# Patient Record
Sex: Female | Born: 1960 | Race: White | Hispanic: No | Marital: Single | Smoking: Current every day smoker
Health system: Southern US, Community
[De-identification: ages and names within clinical notes are randomized; demographics above are authoritative.]

## PROBLEM LIST (undated history)

## (undated) DIAGNOSIS — R112 Nausea with vomiting, unspecified: Secondary | ICD-10-CM

## (undated) DIAGNOSIS — I10 Essential (primary) hypertension: Secondary | ICD-10-CM

## (undated) DIAGNOSIS — I82409 Acute embolism and thrombosis of unspecified deep veins of unspecified lower extremity: Secondary | ICD-10-CM

## (undated) DIAGNOSIS — F32A Depression, unspecified: Secondary | ICD-10-CM

## (undated) DIAGNOSIS — F329 Major depressive disorder, single episode, unspecified: Secondary | ICD-10-CM

## (undated) DIAGNOSIS — I509 Heart failure, unspecified: Secondary | ICD-10-CM

## (undated) DIAGNOSIS — F419 Anxiety disorder, unspecified: Secondary | ICD-10-CM

## (undated) DIAGNOSIS — R011 Cardiac murmur, unspecified: Secondary | ICD-10-CM

## (undated) DIAGNOSIS — I2510 Atherosclerotic heart disease of native coronary artery without angina pectoris: Secondary | ICD-10-CM

## (undated) DIAGNOSIS — Z9889 Other specified postprocedural states: Secondary | ICD-10-CM

## (undated) DIAGNOSIS — M1990 Unspecified osteoarthritis, unspecified site: Secondary | ICD-10-CM

## (undated) DIAGNOSIS — I219 Acute myocardial infarction, unspecified: Secondary | ICD-10-CM

## (undated) DIAGNOSIS — I739 Peripheral vascular disease, unspecified: Secondary | ICD-10-CM

## (undated) HISTORY — PX: SALPINGOOPHORECTOMY: SHX82

## (undated) HISTORY — DX: Acute myocardial infarction, unspecified: I21.9

## (undated) HISTORY — PX: ANTERIOR AND POSTERIOR VAGINAL REPAIR: SUR5

## (undated) HISTORY — PX: OTHER SURGICAL HISTORY: SHX169

## (undated) HISTORY — PX: BACK SURGERY: SHX140

## (undated) HISTORY — PX: ABDOMINAL HYSTERECTOMY: SHX81

## (undated) HISTORY — PX: BREAST LUMPECTOMY: SHX2

## (undated) HISTORY — DX: Atherosclerotic heart disease of native coronary artery without angina pectoris: I25.10

## (undated) HISTORY — PX: TENOPLASTY HAMSTRING / FEMUR: SUR1335

## (undated) HISTORY — DX: Heart failure, unspecified: I50.9

## (undated) HISTORY — PX: TUBAL LIGATION: SHX77

---

## 1998-03-26 ENCOUNTER — Other Ambulatory Visit: Admission: RE | Admit: 1998-03-26 | Discharge: 1998-03-26 | Payer: Self-pay | Admitting: Obstetrics and Gynecology

## 1999-03-07 ENCOUNTER — Observation Stay (HOSPITAL_COMMUNITY): Admission: RE | Admit: 1999-03-07 | Discharge: 1999-03-08 | Payer: Self-pay | Admitting: Orthopedic Surgery

## 1999-03-07 ENCOUNTER — Encounter: Payer: Self-pay | Admitting: Orthopedic Surgery

## 2006-01-22 ENCOUNTER — Other Ambulatory Visit: Admission: RE | Admit: 2006-01-22 | Discharge: 2006-01-22 | Payer: Self-pay | Admitting: Obstetrics and Gynecology

## 2006-10-11 ENCOUNTER — Ambulatory Visit (HOSPITAL_COMMUNITY): Admission: RE | Admit: 2006-10-11 | Discharge: 2006-10-11 | Payer: Self-pay | Admitting: Specialist

## 2013-10-13 ENCOUNTER — Emergency Department (HOSPITAL_BASED_OUTPATIENT_CLINIC_OR_DEPARTMENT_OTHER): Payer: No Typology Code available for payment source

## 2013-10-13 ENCOUNTER — Encounter (HOSPITAL_BASED_OUTPATIENT_CLINIC_OR_DEPARTMENT_OTHER): Payer: Self-pay | Admitting: Emergency Medicine

## 2013-10-13 ENCOUNTER — Emergency Department (HOSPITAL_BASED_OUTPATIENT_CLINIC_OR_DEPARTMENT_OTHER)
Admission: EM | Admit: 2013-10-13 | Discharge: 2013-10-13 | Disposition: A | Discharge status: Home / Self Care | Payer: No Typology Code available for payment source | Attending: Emergency Medicine | Admitting: Emergency Medicine

## 2013-10-13 DIAGNOSIS — W19XXXA Unspecified fall, initial encounter: Secondary | ICD-10-CM

## 2013-10-13 DIAGNOSIS — F17200 Nicotine dependence, unspecified, uncomplicated: Secondary | ICD-10-CM | POA: Insufficient documentation

## 2013-10-13 DIAGNOSIS — Y9301 Activity, walking, marching and hiking: Secondary | ICD-10-CM | POA: Insufficient documentation

## 2013-10-13 DIAGNOSIS — S2231XA Fracture of one rib, right side, initial encounter for closed fracture: Secondary | ICD-10-CM

## 2013-10-13 DIAGNOSIS — I10 Essential (primary) hypertension: Secondary | ICD-10-CM | POA: Insufficient documentation

## 2013-10-13 DIAGNOSIS — F329 Major depressive disorder, single episode, unspecified: Secondary | ICD-10-CM | POA: Insufficient documentation

## 2013-10-13 DIAGNOSIS — S3981XA Other specified injuries of abdomen, initial encounter: Secondary | ICD-10-CM | POA: Insufficient documentation

## 2013-10-13 DIAGNOSIS — S2249XA Multiple fractures of ribs, unspecified side, initial encounter for closed fracture: Secondary | ICD-10-CM | POA: Insufficient documentation

## 2013-10-13 DIAGNOSIS — Y9289 Other specified places as the place of occurrence of the external cause: Secondary | ICD-10-CM | POA: Insufficient documentation

## 2013-10-13 DIAGNOSIS — W010XXA Fall on same level from slipping, tripping and stumbling without subsequent striking against object, initial encounter: Secondary | ICD-10-CM | POA: Insufficient documentation

## 2013-10-13 DIAGNOSIS — F3289 Other specified depressive episodes: Secondary | ICD-10-CM | POA: Insufficient documentation

## 2013-10-13 DIAGNOSIS — Z79899 Other long term (current) drug therapy: Secondary | ICD-10-CM | POA: Insufficient documentation

## 2013-10-13 DIAGNOSIS — F411 Generalized anxiety disorder: Secondary | ICD-10-CM | POA: Insufficient documentation

## 2013-10-13 HISTORY — DX: Essential (primary) hypertension: I10

## 2013-10-13 HISTORY — DX: Depression, unspecified: F32.A

## 2013-10-13 HISTORY — DX: Major depressive disorder, single episode, unspecified: F32.9

## 2013-10-13 HISTORY — DX: Anxiety disorder, unspecified: F41.9

## 2013-10-13 LAB — BASIC METABOLIC PANEL
BUN: 16 mg/dL (ref 6–23)
CO2: 27 mEq/L (ref 19–32)
Calcium: 9.5 mg/dL (ref 8.4–10.5)
Chloride: 102 mEq/L (ref 96–112)
Creatinine, Ser: 1 mg/dL (ref 0.50–1.10)
GFR calc non Af Amer: 64 mL/min — ABNORMAL LOW (ref >90)
Glucose, Bld: 115 mg/dL — ABNORMAL HIGH (ref 70–99)
Potassium: 3.8 mEq/L (ref 3.5–5.1)

## 2013-10-13 MED ORDER — OXYCODONE-ACETAMINOPHEN 5-325 MG PO TABS
2.0000 | ORAL_TABLET | Freq: Once | ORAL | Status: AC
  Administered 2013-10-13: 2 via ORAL
  Filled 2013-10-13: qty 2

## 2013-10-13 MED ORDER — OXYCODONE-ACETAMINOPHEN 5-325 MG PO TABS: 2.0000 | ORAL_TABLET | ORAL | Status: DC | PRN

## 2013-10-13 MED ORDER — CLONIDINE HCL 0.1 MG PO TABS
0.2000 mg | ORAL_TABLET | Freq: Once | ORAL | Status: DC
  Filled 2013-10-13: qty 2

## 2013-10-13 MED ORDER — CLONIDINE HCL 0.1 MG PO TABS
0.1000 mg | ORAL_TABLET | Freq: Once | ORAL | Status: AC
  Administered 2013-10-13: 0.1 mg via ORAL

## 2013-10-13 MED ORDER — IOHEXOL 300 MG/ML  SOLN
100.0000 mL | Freq: Once | INTRAMUSCULAR | Status: AC | PRN
  Administered 2013-10-13: 100 mL via INTRAVENOUS

## 2013-10-13 NOTE — ED Notes (Signed)
Rt side pain onset last pm ater falling on shop vac,  Pt ambulatory  Lo loc

## 2013-10-13 NOTE — ED Notes (Signed)
Fall. She tripped over a shop vac while walking to the bathroom in the dark last night. Right rib pain. BP noted. She admits she did not take her BP medication this am.

## 2013-10-13 NOTE — ED Provider Notes (Signed)
CSN: [id number]     Arrival date & time 10/13/13  1144 History   First MD Initiated Contact with Patient 10/13/13 1418     Chief Complaint  Patient presents with  . Fall   (Consider location/radiation/quality/duration/timing/severity/associated sxs/prior Treatment) HPI 52 year old female who fell during the night last night when she was going to the bathroom. She states she landed on a shop vac.  She has pain in the right flank area. She denies striking her head or having any loss of consciousness. She denies any neck or back pain, numbness, tingling, or weakness. She does not feel lightheaded. Past Medical History  Diagnosis Date  . Hypertension   . Depression   . Anxiety    Past Surgical History  Procedure Laterality Date  . Back surgery    . Abdominal hysterectomy     No family history on file. History  Substance Use Topics  . Smoking status: Current Every Day Smoker -- 0.50 packs/day    Types: Cigarettes  . Smokeless tobacco: Not on file  . Alcohol Use: Yes   OB History   Grav Para Term Preterm Abortions TAB SAB Ect Mult Living                 Review of Systems  All other systems reviewed and are negative.    Allergies  Review of patient's allergies indicates no known allergies.  Home Medications   Current Outpatient Rx  Name  Route  Sig  Dispense  Refill  . ALPRAZolam (XANAX) 1 MG tablet   Oral   Take 1 mg by mouth at bedtime as needed for sleep.         . AmLODIPine Besylate (NORVASC PO)   Oral   Take by mouth.         . CLONIDINE HCL PO   Oral   Take by mouth.         Marland Kitchen HYDROCHLOROTHIAZIDE PO   Oral   Take by mouth.         Marland Kitchen LISINOPRIL PO   Oral   Take by mouth.         . Nebivolol HCl (BYSTOLIC PO)   Oral   Take by mouth.         . venlafaxine (EFFEXOR) 75 MG tablet   Oral   Take 75 mg by mouth 2 (two) times daily.          BP 256/93  Pulse 77  Temp(Src) 98.6 F (37 C) (Oral)  Resp 20  Ht 5\' 5"  (1.651 m)  Wt 220  lb (99.791 kg)  BMI 36.61 kg/m2 Physical Exam  Nursing note and vitals reviewed. Constitutional: She is oriented to person, place, and time. She appears well-developed and well-nourished.  HENT:  Head: Normocephalic and atraumatic.  Eyes: Conjunctivae are normal. Pupils are equal, round, and reactive to light.  Neck: Normal range of motion. Neck supple.  Cardiovascular: Normal rate, regular rhythm, normal heart sounds and intact distal pulses.   Pulmonary/Chest: Effort normal and breath sounds normal.  TTP right lateral lower ribs and ruq of abdomen.   Abdominal: Soft. Bowel sounds are normal.  Musculoskeletal: Normal range of motion. She exhibits tenderness.  Neurological: She is alert and oriented to person, place, and time. She has normal reflexes. No cranial nerve deficit. Coordination normal.  Skin: Skin is warm and dry.  Psychiatric: She has a normal mood and affect. Her behavior is normal. Judgment and thought content normal.    ED Course  Procedures (including critical care time) Labs Review Labs Reviewed  BASIC METABOLIC PANEL   Imaging Review Dg Ribs Unilateral W/chest Right  10/13/2013   CLINICAL DATA:  Right lower rib pain.  EXAM: RIGHT RIBS AND CHEST - 3+ VIEW  COMPARISON:  Chest x-Tushar Enns 10/14/2010.  FINDINGS: The cardiac silhouette, mediastinal and hilar contours are within normal limits and stable. The lungs are clear. No pleural effusion or pneumothorax. A hiatal hernia is again noted.  Dedicated views of the right ribs demonstrate nondisplaced 8th and 9th anterior rib fractures.  IMPRESSION: No acute cardiopulmonary findings.  Right 8th and 9th rib fractures.   Electronically Signed   By: Loralie Champagne M.D.   On: 10/13/2013 13:41    EKG Interpretation   None       MDM  No diagnosis found. 52 year old female tripped and fell and presents today with rib fractures and hypertension. Should not take her antihypertensives today. She is on clonidine. She is also  having pain. She's having a CT of her abdomen she has right upper quadrant pain. Patient with two rib fractures.  No intrabdominal injury- ct report question diverticultis which is not consistent with her history or exam.    Hilario Quarry, MD 10/13/13 [id number]

## 2016-02-14 DIAGNOSIS — N950 Postmenopausal bleeding: Secondary | ICD-10-CM | POA: Insufficient documentation

## 2016-09-12 DIAGNOSIS — Z79899 Other long term (current) drug therapy: Secondary | ICD-10-CM | POA: Diagnosis not present

## 2016-09-12 DIAGNOSIS — E559 Vitamin D deficiency, unspecified: Secondary | ICD-10-CM | POA: Diagnosis not present

## 2016-09-12 DIAGNOSIS — I10 Essential (primary) hypertension: Secondary | ICD-10-CM | POA: Diagnosis not present

## 2016-09-12 DIAGNOSIS — E785 Hyperlipidemia, unspecified: Secondary | ICD-10-CM | POA: Diagnosis not present

## 2016-09-12 DIAGNOSIS — F419 Anxiety disorder, unspecified: Secondary | ICD-10-CM | POA: Diagnosis not present

## 2016-10-10 DIAGNOSIS — E785 Hyperlipidemia, unspecified: Secondary | ICD-10-CM | POA: Diagnosis not present

## 2016-10-10 DIAGNOSIS — F419 Anxiety disorder, unspecified: Secondary | ICD-10-CM | POA: Diagnosis not present

## 2016-10-10 DIAGNOSIS — F329 Major depressive disorder, single episode, unspecified: Secondary | ICD-10-CM | POA: Diagnosis not present

## 2016-10-10 DIAGNOSIS — I10 Essential (primary) hypertension: Secondary | ICD-10-CM | POA: Diagnosis not present

## 2017-02-13 DIAGNOSIS — J40 Bronchitis, not specified as acute or chronic: Secondary | ICD-10-CM | POA: Diagnosis not present

## 2017-02-13 DIAGNOSIS — E559 Vitamin D deficiency, unspecified: Secondary | ICD-10-CM | POA: Diagnosis not present

## 2017-02-13 DIAGNOSIS — F17210 Nicotine dependence, cigarettes, uncomplicated: Secondary | ICD-10-CM | POA: Diagnosis not present

## 2017-02-13 DIAGNOSIS — E785 Hyperlipidemia, unspecified: Secondary | ICD-10-CM | POA: Diagnosis not present

## 2017-02-22 DIAGNOSIS — J029 Acute pharyngitis, unspecified: Secondary | ICD-10-CM | POA: Diagnosis not present

## 2017-02-22 DIAGNOSIS — K1379 Other lesions of oral mucosa: Secondary | ICD-10-CM | POA: Diagnosis not present

## 2017-02-22 DIAGNOSIS — R05 Cough: Secondary | ICD-10-CM | POA: Diagnosis not present

## 2017-07-30 DIAGNOSIS — I10 Essential (primary) hypertension: Secondary | ICD-10-CM | POA: Diagnosis not present

## 2017-07-30 DIAGNOSIS — Z124 Encounter for screening for malignant neoplasm of cervix: Secondary | ICD-10-CM | POA: Diagnosis not present

## 2017-07-30 DIAGNOSIS — Z0001 Encounter for general adult medical examination with abnormal findings: Secondary | ICD-10-CM | POA: Diagnosis not present

## 2017-07-30 DIAGNOSIS — F329 Major depressive disorder, single episode, unspecified: Secondary | ICD-10-CM | POA: Diagnosis not present

## 2017-07-30 DIAGNOSIS — F419 Anxiety disorder, unspecified: Secondary | ICD-10-CM | POA: Diagnosis not present

## 2017-07-30 DIAGNOSIS — E559 Vitamin D deficiency, unspecified: Secondary | ICD-10-CM | POA: Diagnosis not present

## 2017-08-02 DIAGNOSIS — Z1211 Encounter for screening for malignant neoplasm of colon: Secondary | ICD-10-CM | POA: Diagnosis not present

## 2017-08-06 ENCOUNTER — Emergency Department (HOSPITAL_COMMUNITY): Payer: BLUE CROSS/BLUE SHIELD

## 2017-08-06 ENCOUNTER — Emergency Department (HOSPITAL_COMMUNITY)
Admission: EM | Admit: 2017-08-06 | Discharge: 2017-08-06 | Disposition: A | Discharge status: Home / Self Care | Payer: BLUE CROSS/BLUE SHIELD | Attending: Physician Assistant | Admitting: Physician Assistant

## 2017-08-06 ENCOUNTER — Encounter (HOSPITAL_COMMUNITY): Payer: Self-pay | Admitting: *Deleted

## 2017-08-06 DIAGNOSIS — M549 Dorsalgia, unspecified: Secondary | ICD-10-CM | POA: Diagnosis not present

## 2017-08-06 DIAGNOSIS — Z79899 Other long term (current) drug therapy: Secondary | ICD-10-CM | POA: Diagnosis not present

## 2017-08-06 DIAGNOSIS — Y9389 Activity, other specified: Secondary | ICD-10-CM | POA: Insufficient documentation

## 2017-08-06 DIAGNOSIS — I10 Essential (primary) hypertension: Secondary | ICD-10-CM | POA: Diagnosis not present

## 2017-08-06 DIAGNOSIS — S299XXA Unspecified injury of thorax, initial encounter: Secondary | ICD-10-CM | POA: Diagnosis not present

## 2017-08-06 DIAGNOSIS — M546 Pain in thoracic spine: Secondary | ICD-10-CM | POA: Diagnosis not present

## 2017-08-06 DIAGNOSIS — S80812A Abrasion, left lower leg, initial encounter: Secondary | ICD-10-CM | POA: Diagnosis not present

## 2017-08-06 DIAGNOSIS — M25511 Pain in right shoulder: Secondary | ICD-10-CM | POA: Diagnosis not present

## 2017-08-06 DIAGNOSIS — Y92007 Garden or yard of unspecified non-institutional (private) residence as the place of occurrence of the external cause: Secondary | ICD-10-CM | POA: Diagnosis not present

## 2017-08-06 DIAGNOSIS — Y999 Unspecified external cause status: Secondary | ICD-10-CM | POA: Insufficient documentation

## 2017-08-06 DIAGNOSIS — F17210 Nicotine dependence, cigarettes, uncomplicated: Secondary | ICD-10-CM | POA: Insufficient documentation

## 2017-08-06 DIAGNOSIS — S3992XA Unspecified injury of lower back, initial encounter: Secondary | ICD-10-CM | POA: Diagnosis not present

## 2017-08-06 DIAGNOSIS — M25551 Pain in right hip: Secondary | ICD-10-CM | POA: Diagnosis not present

## 2017-08-06 DIAGNOSIS — W01198A Fall on same level from slipping, tripping and stumbling with subsequent striking against other object, initial encounter: Secondary | ICD-10-CM | POA: Insufficient documentation

## 2017-08-06 DIAGNOSIS — S79911A Unspecified injury of right hip, initial encounter: Secondary | ICD-10-CM | POA: Diagnosis not present

## 2017-08-06 DIAGNOSIS — S300XXA Contusion of lower back and pelvis, initial encounter: Secondary | ICD-10-CM | POA: Insufficient documentation

## 2017-08-06 MED ORDER — HYDROCODONE-ACETAMINOPHEN 5-325 MG PO TABS: 1.0000 | ORAL_TABLET | Freq: Four times a day (QID) | ORAL | 0 refills | Status: DC | PRN

## 2017-08-06 MED ORDER — CYCLOBENZAPRINE HCL 10 MG PO TABS: 10.0000 mg | ORAL_TABLET | Freq: Every evening | ORAL | 0 refills | Status: DC | PRN

## 2017-08-06 MED ORDER — OXYCODONE-ACETAMINOPHEN 5-325 MG PO TABS
1.0000 | ORAL_TABLET | Freq: Once | ORAL | Status: AC
  Administered 2017-08-06: 1 via ORAL
  Filled 2017-08-06: qty 1

## 2017-08-06 NOTE — Discharge Instructions (Signed)
Please read instructions below. Apply ice to your back (especially the bruise) and shoulder for 20 minutes at a time. You can take Norco every 6 hours as needed for severe pain. Do not take tylenol, drive, or drink alcohol while taking this medication. You can take flexeril at every 8 hours or just at bedtime as needed for muscle spasm. You can take advil every 6 hours as needed for pain. Schedule an appointment with the orthopedic specialist if symptoms persist to follow-up on your injury. Return to ER if new numbness or tingling in your arms or legs, inability to urinate, inability to hold your bowels, or weakness in your extremities.

## 2017-08-06 NOTE — ED Notes (Signed)
PA at the bedside.

## 2017-08-06 NOTE — ED Provider Notes (Signed)
[facility]-EMERGENCY DEPT Provider Note   CSN: [id number] Arrival date & time: 08/06/17  4098     History   Chief Complaint Chief Complaint  Patient presents with  . Fall    HPI Rachel Chen is a 56 y.o. female w past medical history of hypertension, depression, presenting from urgent care with acute onset of persistent right shoulder and hip pain status post mechanical fall that occurred Friday evening. Patient states she was removing the cover from her hot tub when she lost balance, falling forward into the full hot tub. She states she believes her right side struck the seats in the hot tub during the fall. Denies head trauma or LOC. States pain in the right shoulder is located in her scapula, that is worse with movement and breathing. Localizes right hip/lower back pain that is worse with sitting. Also reports abrasion to left anterior shin that has minimally pain. Denies numbness or tingling in extremities, neck pain, bowel or bladder incontinence, chest pain, shortness of breath, abd pain, headache, or anything other symptoms. She is not on  Anticoagulation. States she has not taken her morning BP medications. The history is provided by the patient.    Past Medical History:  Diagnosis Date  . Anxiety   . Depression   . Hypertension     There are no active problems to display for this patient.   Past Surgical History:  Procedure Laterality Date  . ABDOMINAL HYSTERECTOMY    . BACK SURGERY      OB History    No data available       Home Medications    Prior to Admission medications   Medication Sig Start Date End Date Taking? Authorizing Provider  ALPRAZolam Prudy Feeler) 1 MG tablet Take 1 mg by mouth at bedtime as needed for sleep.    [provider]  AmLODIPine Besylate (NORVASC PO) Take by mouth.    [provider]  CLONIDINE HCL PO Take by mouth.    [provider]  cyclobenzaprine (FLEXERIL) 10 MG tablet Take 1 tablet (10 mg total) by mouth  at bedtime as needed for muscle spasms. 08/06/17   Russo, [place] N, PA-C  HYDROCHLOROTHIAZIDE PO Take by mouth.    [provider]  HYDROcodone-acetaminophen (NORCO) 5-325 MG tablet Take 1-2 tablets by mouth every 6 (six) hours as needed for moderate pain or severe pain. 08/06/17   Russo, [place] N, PA-C  LISINOPRIL PO Take by mouth.    [provider]  Nebivolol HCl (BYSTOLIC PO) Take by mouth.    [provider]  oxyCODONE-acetaminophen (PERCOCET/ROXICET) 5-325 MG per tablet Take 2 tablets by mouth every 4 (four) hours as needed for pain. 10/13/13   Margarita Grizzle, MD  venlafaxine (EFFEXOR) 75 MG tablet Take 75 mg by mouth 2 (two) times daily.    [provider]    Family History No family history on file.  Social History Social History  Substance Use Topics  . Smoking status: Current Every Day Smoker    Packs/day: 1.00    Types: Cigarettes  . Smokeless tobacco: Never Used  . Alcohol use Yes     Comment: occassional     Allergies   Patient has no known allergies.   Review of Systems Review of Systems  Constitutional: Negative for fever.  HENT: Negative for facial swelling.   Eyes: Negative for visual disturbance.  Respiratory: Negative for cough and shortness of breath.   Cardiovascular: Negative for chest pain.  Gastrointestinal: Negative  for anal bleeding, nausea and vomiting.       No bowel incontinence  Genitourinary: Negative for difficulty urinating.  Musculoskeletal: Positive for back pain and myalgias. Negative for neck pain.  Skin: Positive for color change and wound.  Neurological: Negative for syncope, weakness, numbness and headaches.  Hematological: Does not bruise/bleed easily.  Psychiatric/Behavioral: Negative for confusion.     Physical Exam Updated Vital Signs BP (!) 159/90 (BP Location: Left Arm)   Pulse 72   Temp 97.8 F (36.6 C) (Oral)   Resp 16   Ht 5\' 5"  (1.651 m)   Wt 95.7 kg (211 lb)   SpO2 93%   BMI  35.11 kg/m   Physical Exam  Constitutional: She is oriented to person, place, and time. She appears well-developed and well-nourished. No distress.  HENT:  Head: Normocephalic and atraumatic.  Mouth/Throat: Oropharynx is clear and moist.  Eyes: Pupils are equal, round, and reactive to light. Conjunctivae and EOM are normal.  Neck: Normal range of motion. Neck supple.  Cardiovascular: Normal rate, regular rhythm, normal heart sounds and intact distal pulses.  Exam reveals no friction rub.   No murmur heard. Pulmonary/Chest: Effort normal and breath sounds normal. No respiratory distress. She has no wheezes. She has no rales. She exhibits no tenderness.  Abdominal: Soft. Bowel sounds are normal. She exhibits no distension and no mass. There is no tenderness. There is no rebound and no guarding.  Musculoskeletal:  No C, T, or L spine or paraspinal tenderness. Tenderness over sacrum as well as left gluteus. There is ecchymosis/hematoma over sacral region. No bony step-offs, no gross deformities. R lateral hip with small area of ecchymosis and TTP. Hip w nl ROM including int/ext rotation as well as flexion and extension. No crepitus or gross deformities.  Distal right scapula and just medial to with TTP, no ecchymosis. ROM of shoulder elicits pain in scapula region. No tenderness of rotator cuff.   Neurological: She is alert and oriented to person, place, and time. No sensory deficit.  Normal sensation distal extremities. 5/5 strength bilateral upper and lower extremities. Normal gait.   Skin: Skin is warm.  Superficial abrasion noted to the distal left anterior shin. No surrounding erythema, purulent drainage, or tenderness to palpation.  Psychiatric: She has a normal mood and affect. Her behavior is normal.  Nursing note and vitals reviewed.    ED Treatments / Results  Labs (all labs ordered are listed, but only abnormal results are displayed) Labs Reviewed - No data to display  EKG   EKG Interpretation None       Radiology Dg Chest 2 View  Result Date: 08/06/2017 CLINICAL DATA:  Fall.  Upper back pain.  Lower right side pain. EXAM: CHEST  2 VIEW COMPARISON:  None. FINDINGS: Large hiatal hernia. Heart and mediastinal contours are within normal limits. No focal opacities or effusions. No acute bony abnormality. No pneumothorax. IMPRESSION: No active cardiopulmonary disease.  Large hiatal hernia. Electronically Signed   By: Charlett Nose M.D.   On: 08/06/2017 11:53   Dg Thoracic Spine 2 View  Result Date: 08/06/2017 CLINICAL DATA:  Fall, back pain EXAM: THORACIC SPINE 2 VIEWS COMPARISON:  CT chest 02/04/2015 FINDINGS: Mild leftward scoliosis in the lower thoracic spine. Mild spurring and disc space narrowing in the lower thoracic spine. No fracture or subluxation. Large hiatal hernia. IMPRESSION: Mild degenerative changes and leftward scoliosis. No acute findings. Electronically Signed   By: Charlett Nose M.D.   On: 08/06/2017 11:55  Dg Lumbar Spine 2-3 Views  Result Date: 08/06/2017 CLINICAL DATA:  Larey Seat in hot tub, pt said she "somersaulted" in hot tub while changing the cover, Friday. Pain in upper back in between shoulder blades, radiating to breast (since this morning); lower right sided back pain radiating into right hip EXAM: LUMBAR SPINE - 2-3 VIEW COMPARISON:  10/07/2014. FINDINGS: Joint space narrowing from L3-S1. Endplate osteophytosis. No subluxation. No fracture. IMPRESSION: 1. No acute findings lumbar spine. 2. Multilevel disc osteophytic disease not changed from comparison CT Electronically Signed   By: Genevive Bi M.D.   On: 08/06/2017 12:07   Dg Hip Unilat W Or Wo Pelvis 2-3 Views Right  Result Date: 08/06/2017 CLINICAL DATA:  Recent fall with back pain radiating to the right hip. EXAM: DG HIP (WITH OR WITHOUT PELVIS) 2-3V RIGHT COMPARISON:  None. FINDINGS: Pelvic bony ring is intact. No gross abnormality to the SI joints. Right hip is located without an  acute fracture. No gross abnormality to the left hip. IMPRESSION: No acute abnormality. Electronically Signed   By: Richarda Overlie M.D.   On: 08/06/2017 11:54    Procedures Procedures (including critical care time)  Medications Ordered in ED Medications  oxyCODONE-acetaminophen (PERCOCET/ROXICET) 5-325 MG per tablet 1 tablet (1 tablet Oral Given 08/06/17 1204)     Initial Impression / Assessment and Plan / ED Course  I have reviewed the triage vital signs and the nursing notes.  Pertinent labs & imaging results that were available during my care of the patient were reviewed by me and considered in my medical decision making (see chart for details).     Patient presenting from urgent care with right shoulder, R hip, and sacral hematoma status post mechanical fall that occurred Friday evening. No head trauma or LOC. Hematoma present over the sacrum that appears stable. Pt not on anticoagulation. Patient with no focal neuro deficits and normal neuro exam, no red flags concerning for cauda equina. . Pelvis is stable on exam, R hip with range of motion and no deformity. Right shoulder with tenderness over scapula. Chest x-ray, T-spine, L-spine, and right hip and b/l pelvis imaging all negative for acute fracture or dislocation. Patient can walk but states is painful. Pain managed in ED with Percocet. Hemodynamically stable, not in distress. Strict return precautions discussed. Patient is safe for discharge home with symptomatic management. Orthopedic referral given if symptoms persist.  Patient discussed with Dr. Corlis Leak, agrees with care plan.  [place] Controlled Substance reporting System queried  Discussed results, findings, treatment and follow up. Patient advised of return precautions. Patient verbalized understanding and agreed with plan.  Final Clinical Impressions(s) / ED Diagnoses   Final diagnoses:  Contusion of sacrum, initial encounter  Acute pain of right shoulder  Abrasion,  left lower leg, initial encounter    New Prescriptions Discharge Medication List as of 08/06/2017  1:53 PM    START taking these medications   Details  cyclobenzaprine (FLEXERIL) 10 MG tablet Take 1 tablet (10 mg total) by mouth at bedtime as needed for muscle spasms., Starting Mon 08/06/2017, Print    HYDROcodone-acetaminophen (NORCO) 5-325 MG tablet Take 1-2 tablets by mouth every 6 (six) hours as needed for moderate pain or severe pain., Starting Mon 08/06/2017, Print         Timothy Lasso, [place] N, PA-C 08/06/17 2057    Abelino Derrick, MD 08/08/17 1800

## 2017-08-06 NOTE — ED Notes (Signed)
Pt able to ambulate with no issue.

## 2017-08-06 NOTE — ED Triage Notes (Signed)
Pt sent here from [facility] for eval for fall x 3 days, pt has large contusion to sacral area, pt ambulatory, pt reports falling into her hot tub, denies LOC, MAE, pt c/o R mid back pain at the scapula area, R hip pain, & sacral pain, A&O x4

## 2017-10-24 DIAGNOSIS — R300 Dysuria: Secondary | ICD-10-CM | POA: Diagnosis not present

## 2017-10-24 DIAGNOSIS — J029 Acute pharyngitis, unspecified: Secondary | ICD-10-CM | POA: Diagnosis not present

## 2017-10-24 DIAGNOSIS — R197 Diarrhea, unspecified: Secondary | ICD-10-CM | POA: Diagnosis not present

## 2017-10-24 DIAGNOSIS — J3489 Other specified disorders of nose and nasal sinuses: Secondary | ICD-10-CM | POA: Diagnosis not present

## 2017-12-18 HISTORY — PX: VASCULAR SURGERY: SHX849

## 2017-12-28 DIAGNOSIS — Z79899 Other long term (current) drug therapy: Secondary | ICD-10-CM | POA: Diagnosis not present

## 2017-12-28 DIAGNOSIS — J309 Allergic rhinitis, unspecified: Secondary | ICD-10-CM | POA: Diagnosis not present

## 2017-12-28 DIAGNOSIS — E785 Hyperlipidemia, unspecified: Secondary | ICD-10-CM | POA: Diagnosis not present

## 2017-12-28 DIAGNOSIS — E559 Vitamin D deficiency, unspecified: Secondary | ICD-10-CM | POA: Diagnosis not present

## 2017-12-28 DIAGNOSIS — R1011 Right upper quadrant pain: Secondary | ICD-10-CM | POA: Diagnosis not present

## 2017-12-28 DIAGNOSIS — I10 Essential (primary) hypertension: Secondary | ICD-10-CM | POA: Diagnosis not present

## 2017-12-28 DIAGNOSIS — R197 Diarrhea, unspecified: Secondary | ICD-10-CM | POA: Diagnosis not present

## 2018-01-08 DIAGNOSIS — R1011 Right upper quadrant pain: Secondary | ICD-10-CM | POA: Diagnosis not present

## 2018-04-22 DIAGNOSIS — F329 Major depressive disorder, single episode, unspecified: Secondary | ICD-10-CM | POA: Diagnosis not present

## 2018-04-22 DIAGNOSIS — F419 Anxiety disorder, unspecified: Secondary | ICD-10-CM | POA: Diagnosis not present

## 2018-04-22 DIAGNOSIS — Z79899 Other long term (current) drug therapy: Secondary | ICD-10-CM | POA: Diagnosis not present

## 2018-04-22 DIAGNOSIS — I10 Essential (primary) hypertension: Secondary | ICD-10-CM | POA: Diagnosis not present

## 2018-06-01 IMAGING — CR DG CHEST 2V
2 series · 2 of 2 positions shown · non-contrast
Comparison: None.

CLINICAL DATA: Fall.  Upper back pain.  Lower right side pain.

EXAM:
CHEST  2 VIEW

[chest pa]
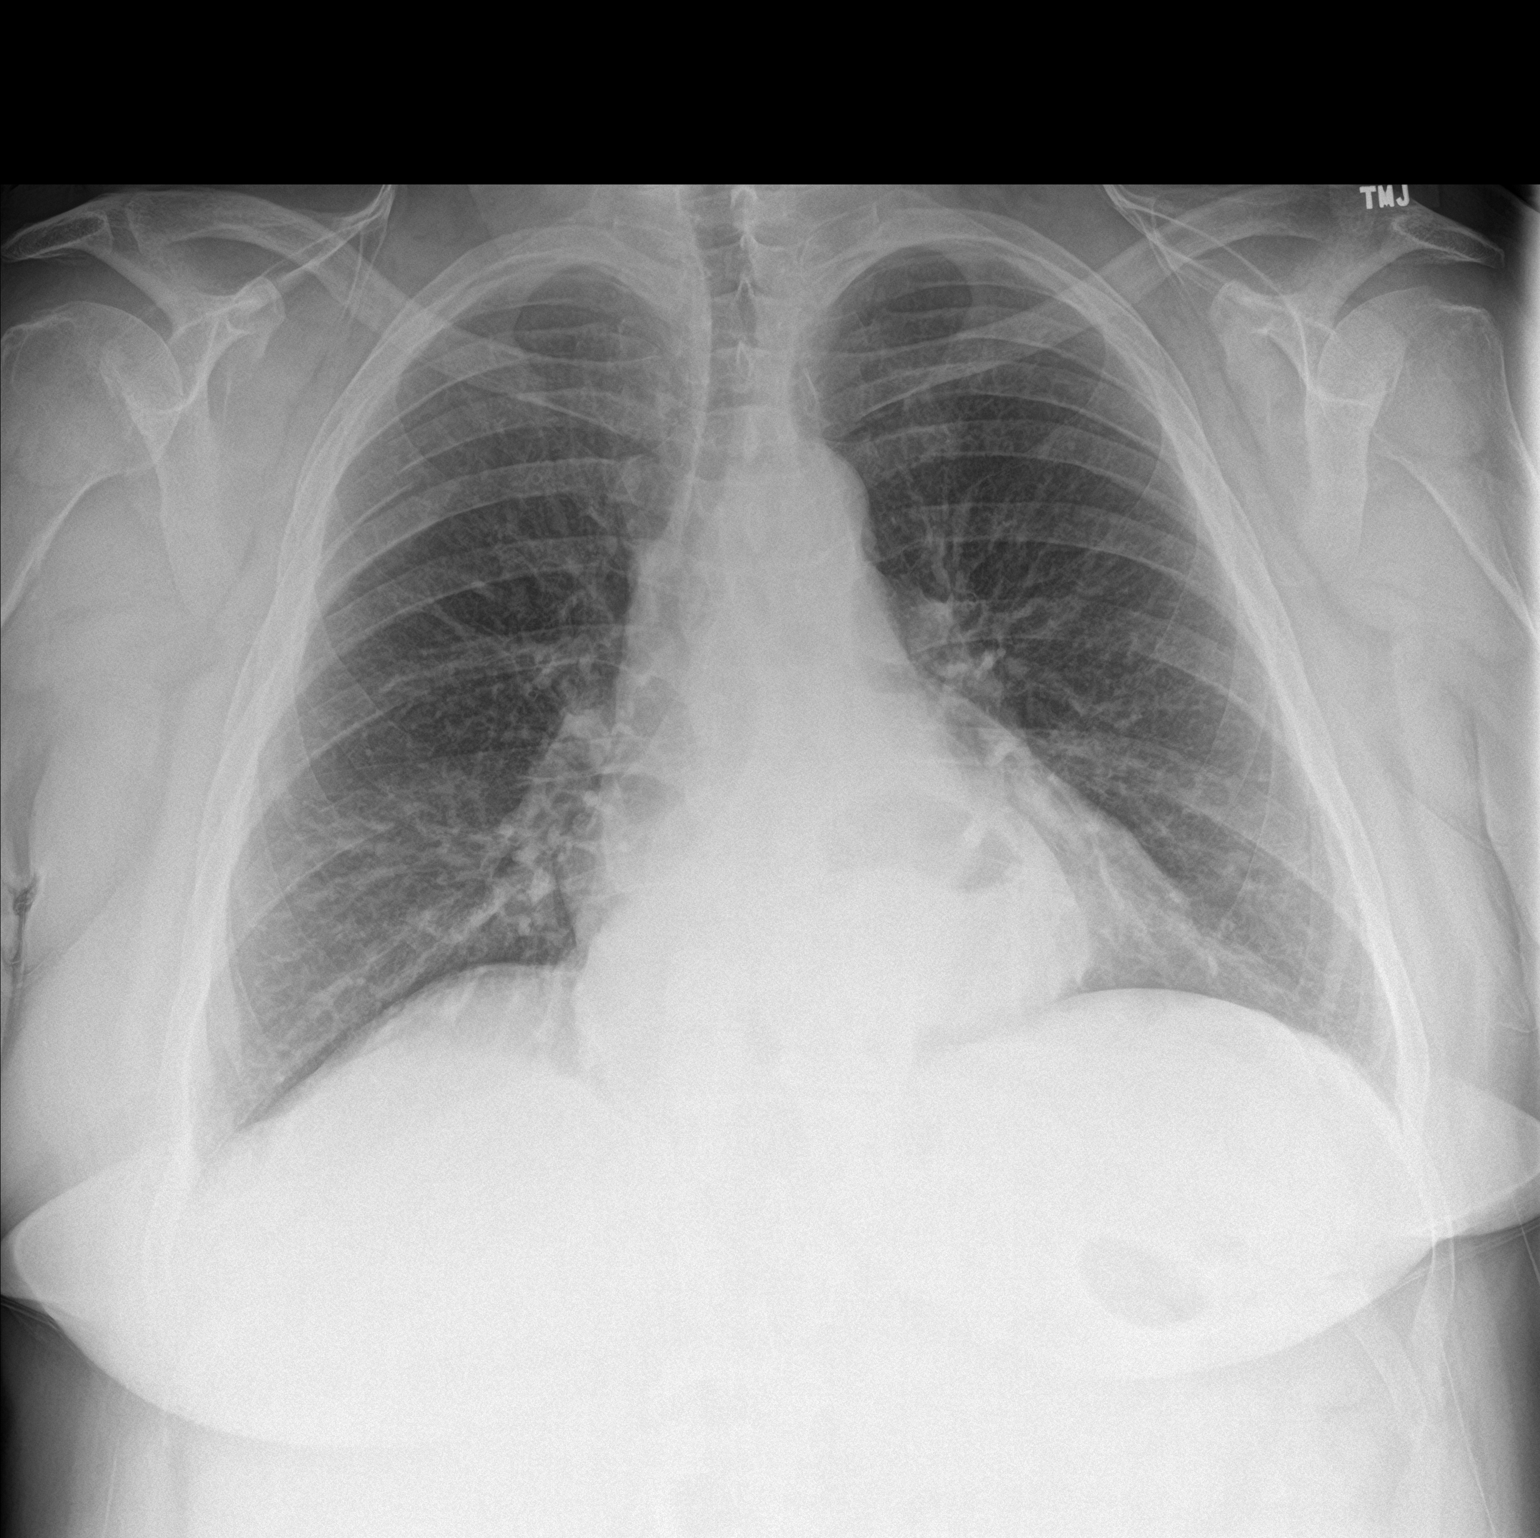

[chest lat]
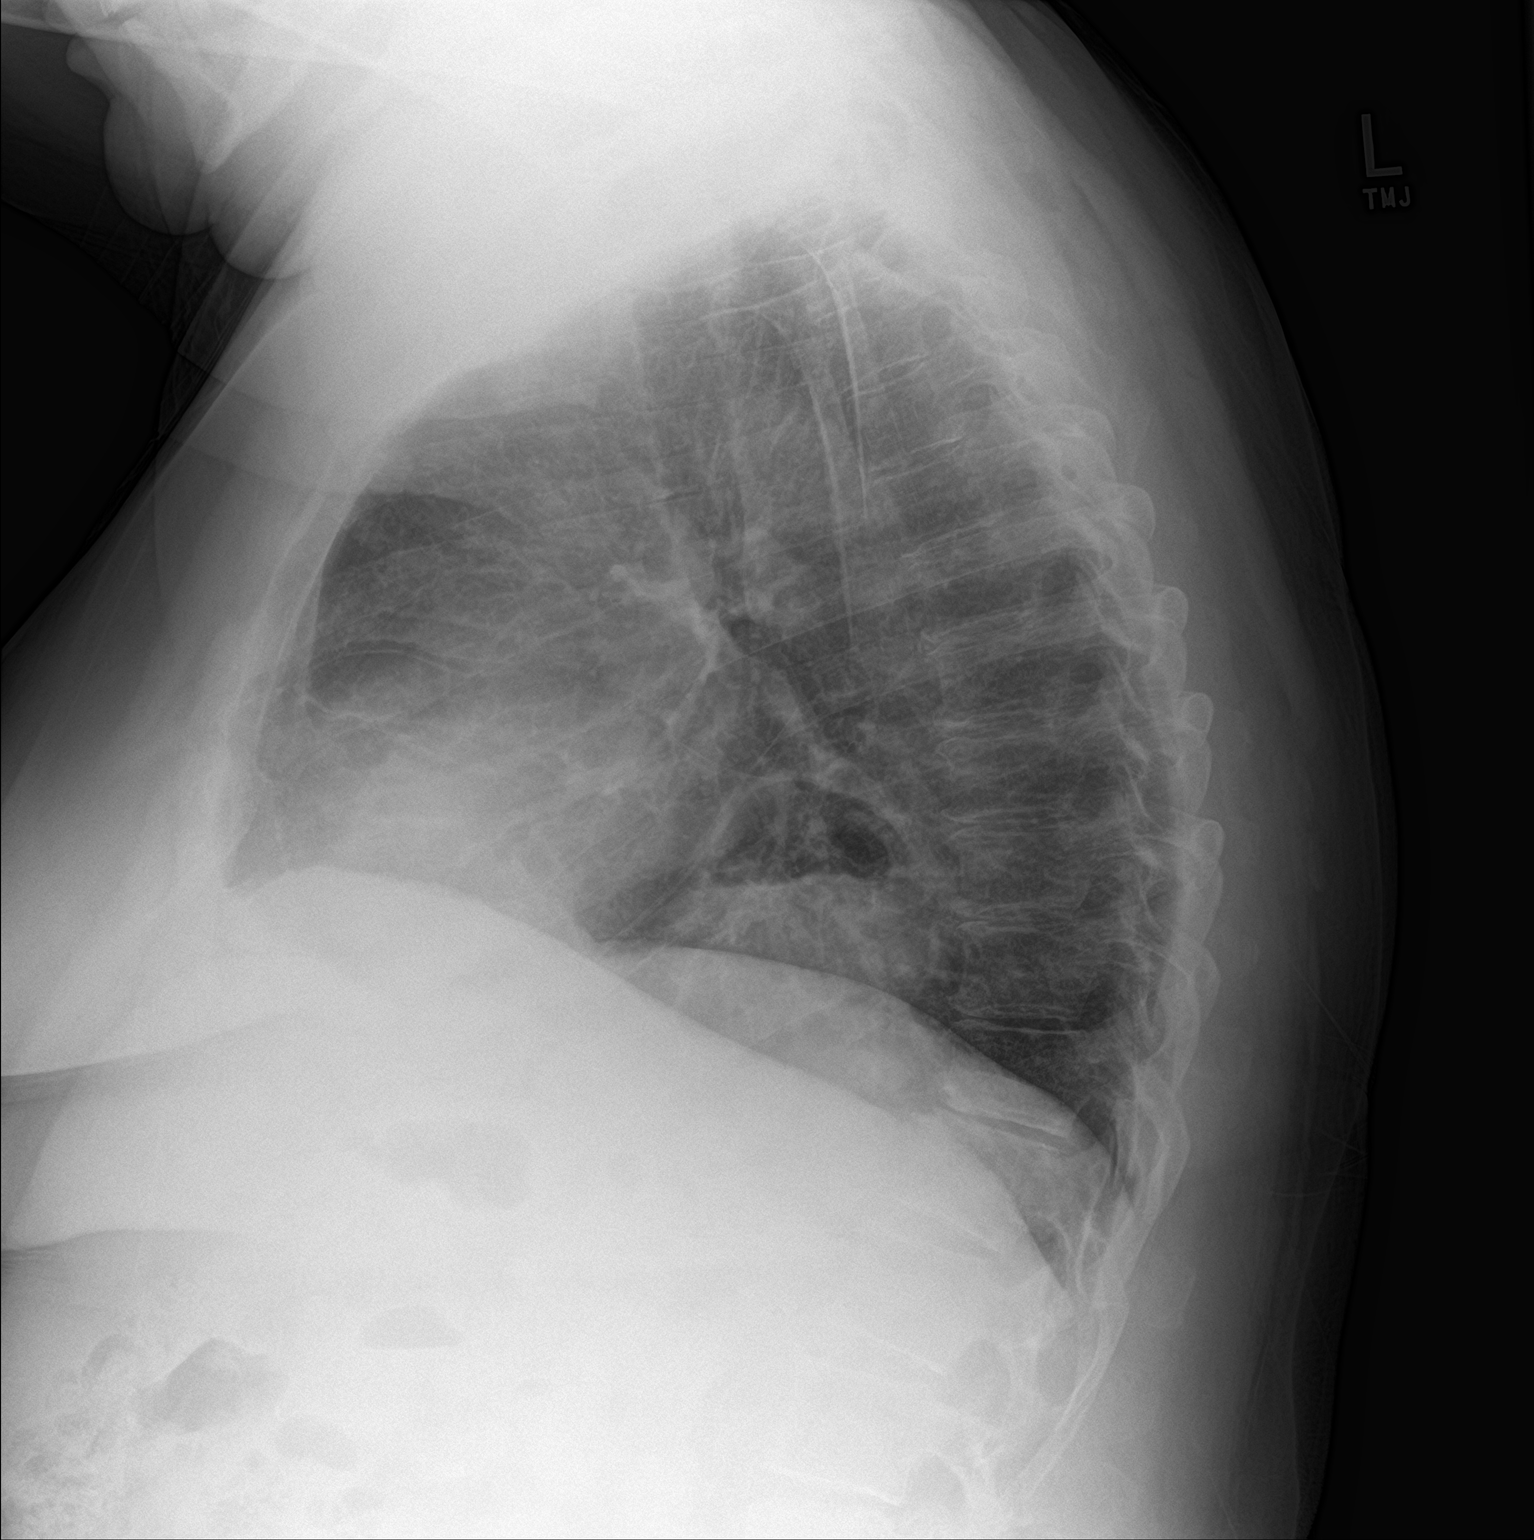

[2 of 2 positions shown; findings below may reference images not displayed]

FINDINGS: Large hiatal hernia. Heart and mediastinal contours are within
normal limits. No focal opacities or effusions. No acute bony
abnormality. No pneumothorax.
IMPRESSION: No active cardiopulmonary disease.  Large hiatal hernia.

## 2018-06-01 IMAGING — CR DG LUMBAR SPINE 2-3V
3 series · 3 of 3 positions shown · non-contrast
Comparison: 10/07/2014.

CLINICAL DATA: Fell in hot tub, pt said she "somersaulted" in hot
tub while changing the cover, [REDACTED]. Pain in upper back in between
shoulder blades, radiating to breast (since this morning); lower
right sided back pain radiating into right hip

EXAM:
LUMBAR SPINE - 2-3 VIEW

[l-spine ap]
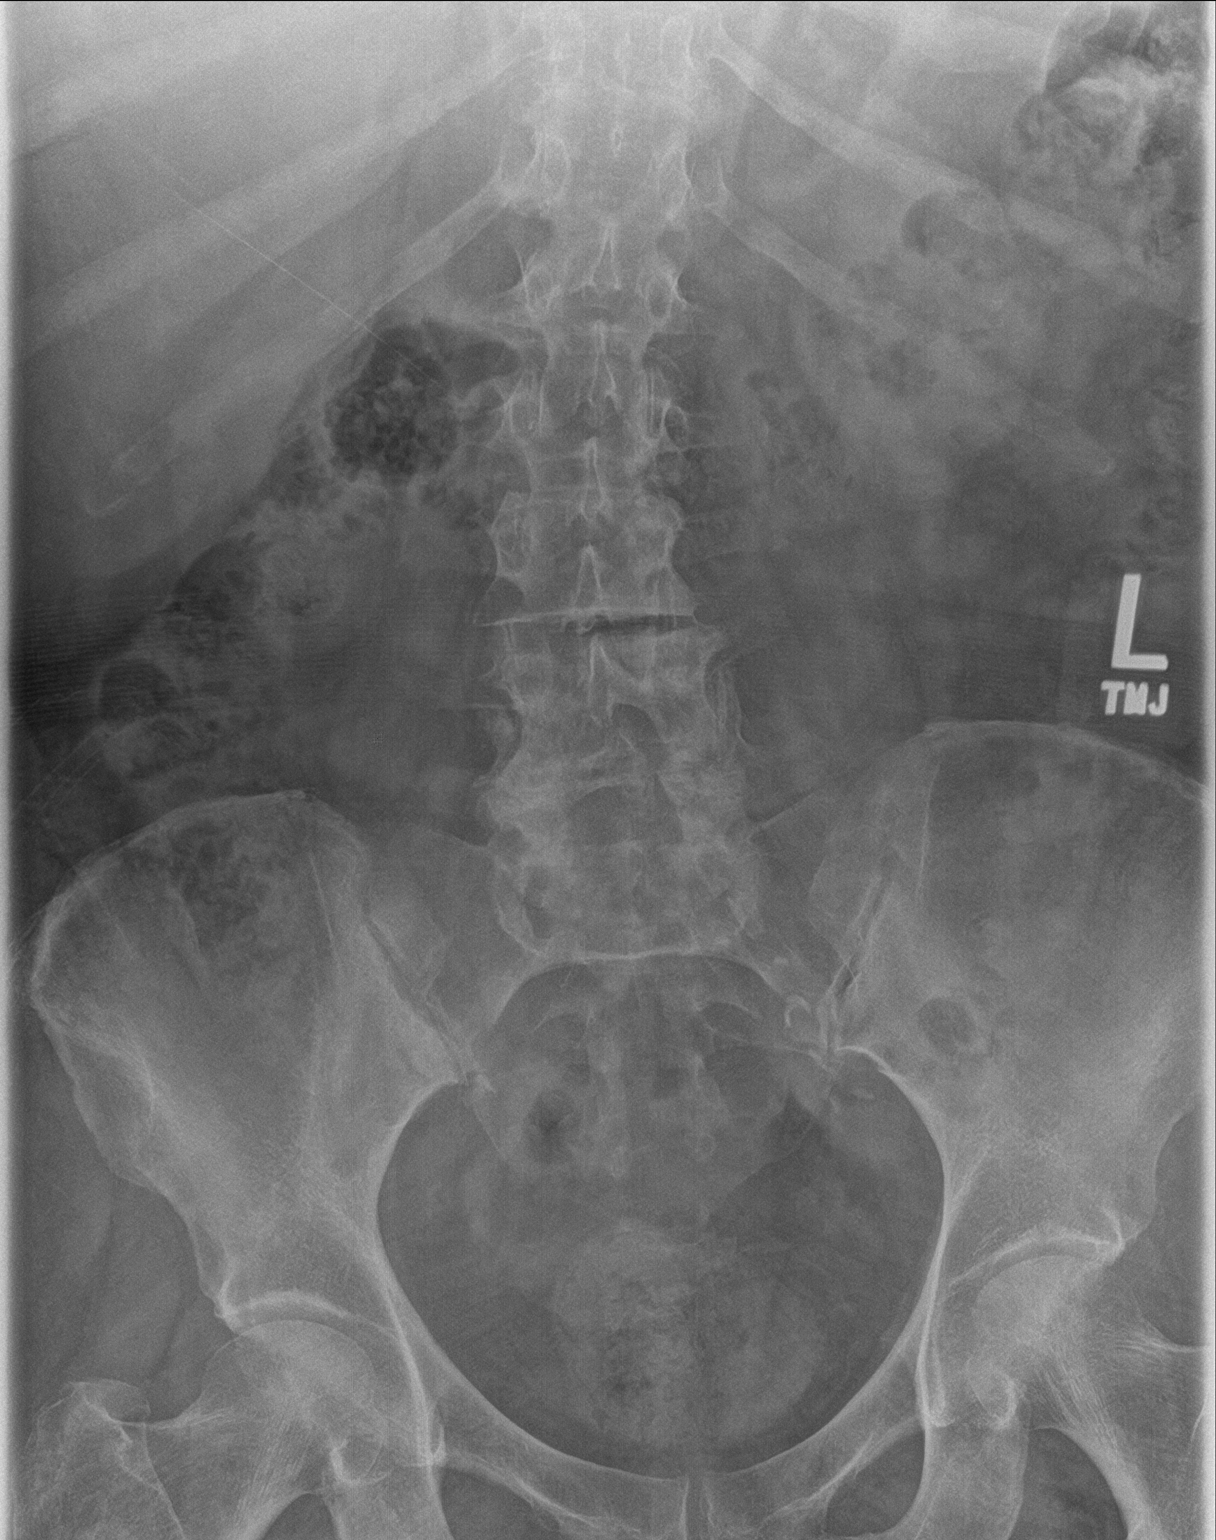

[l-spine lat]
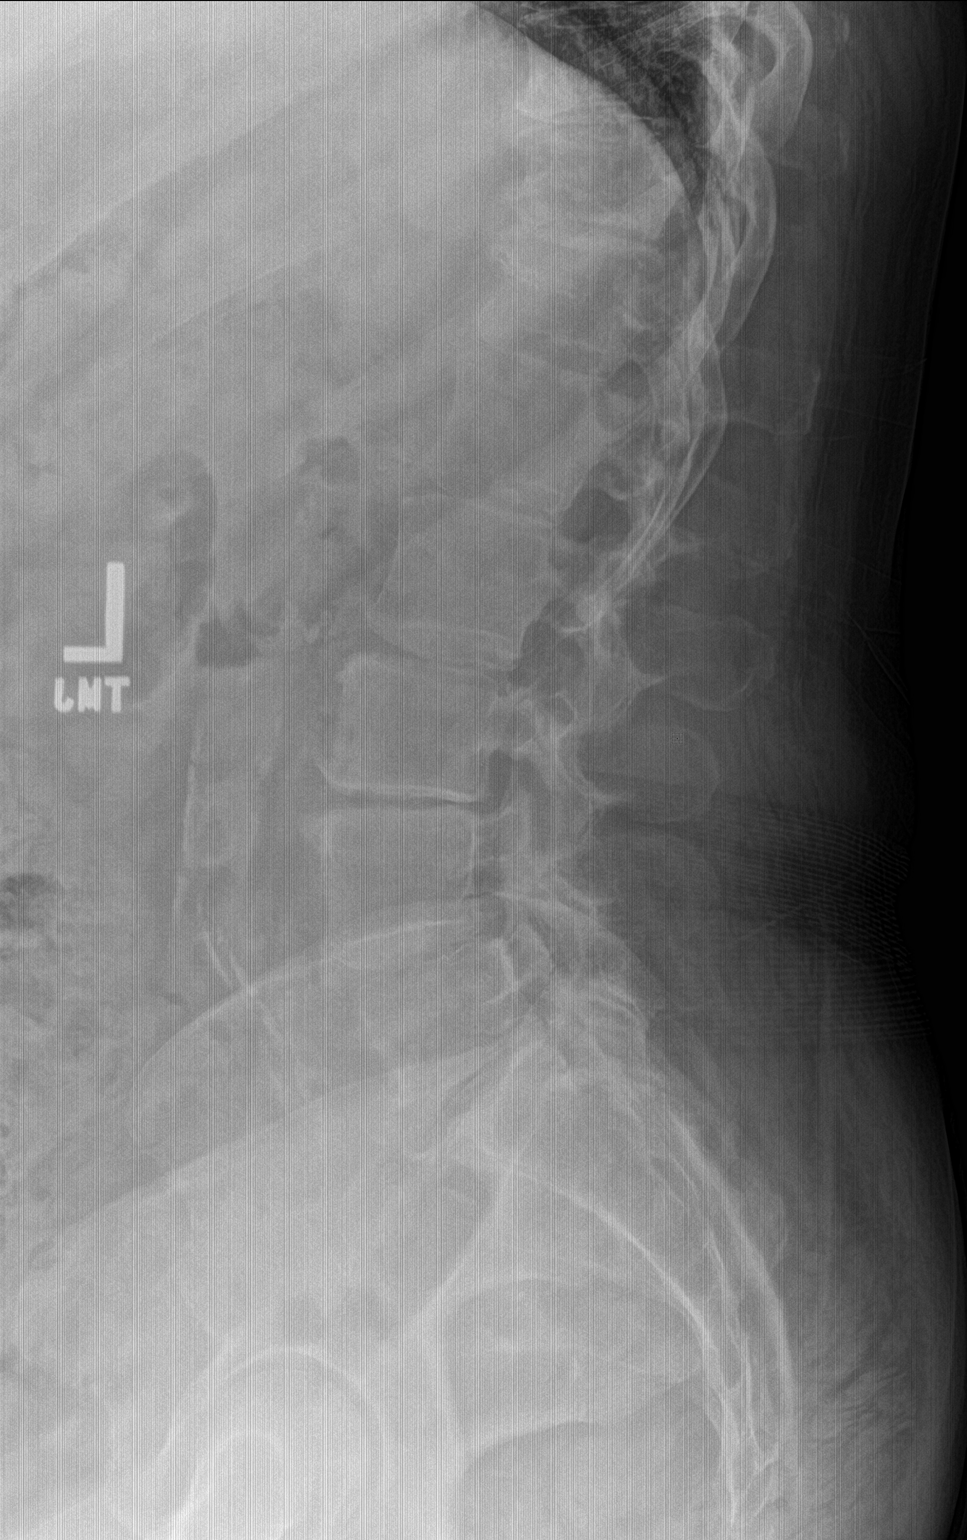

[l-spine spot]
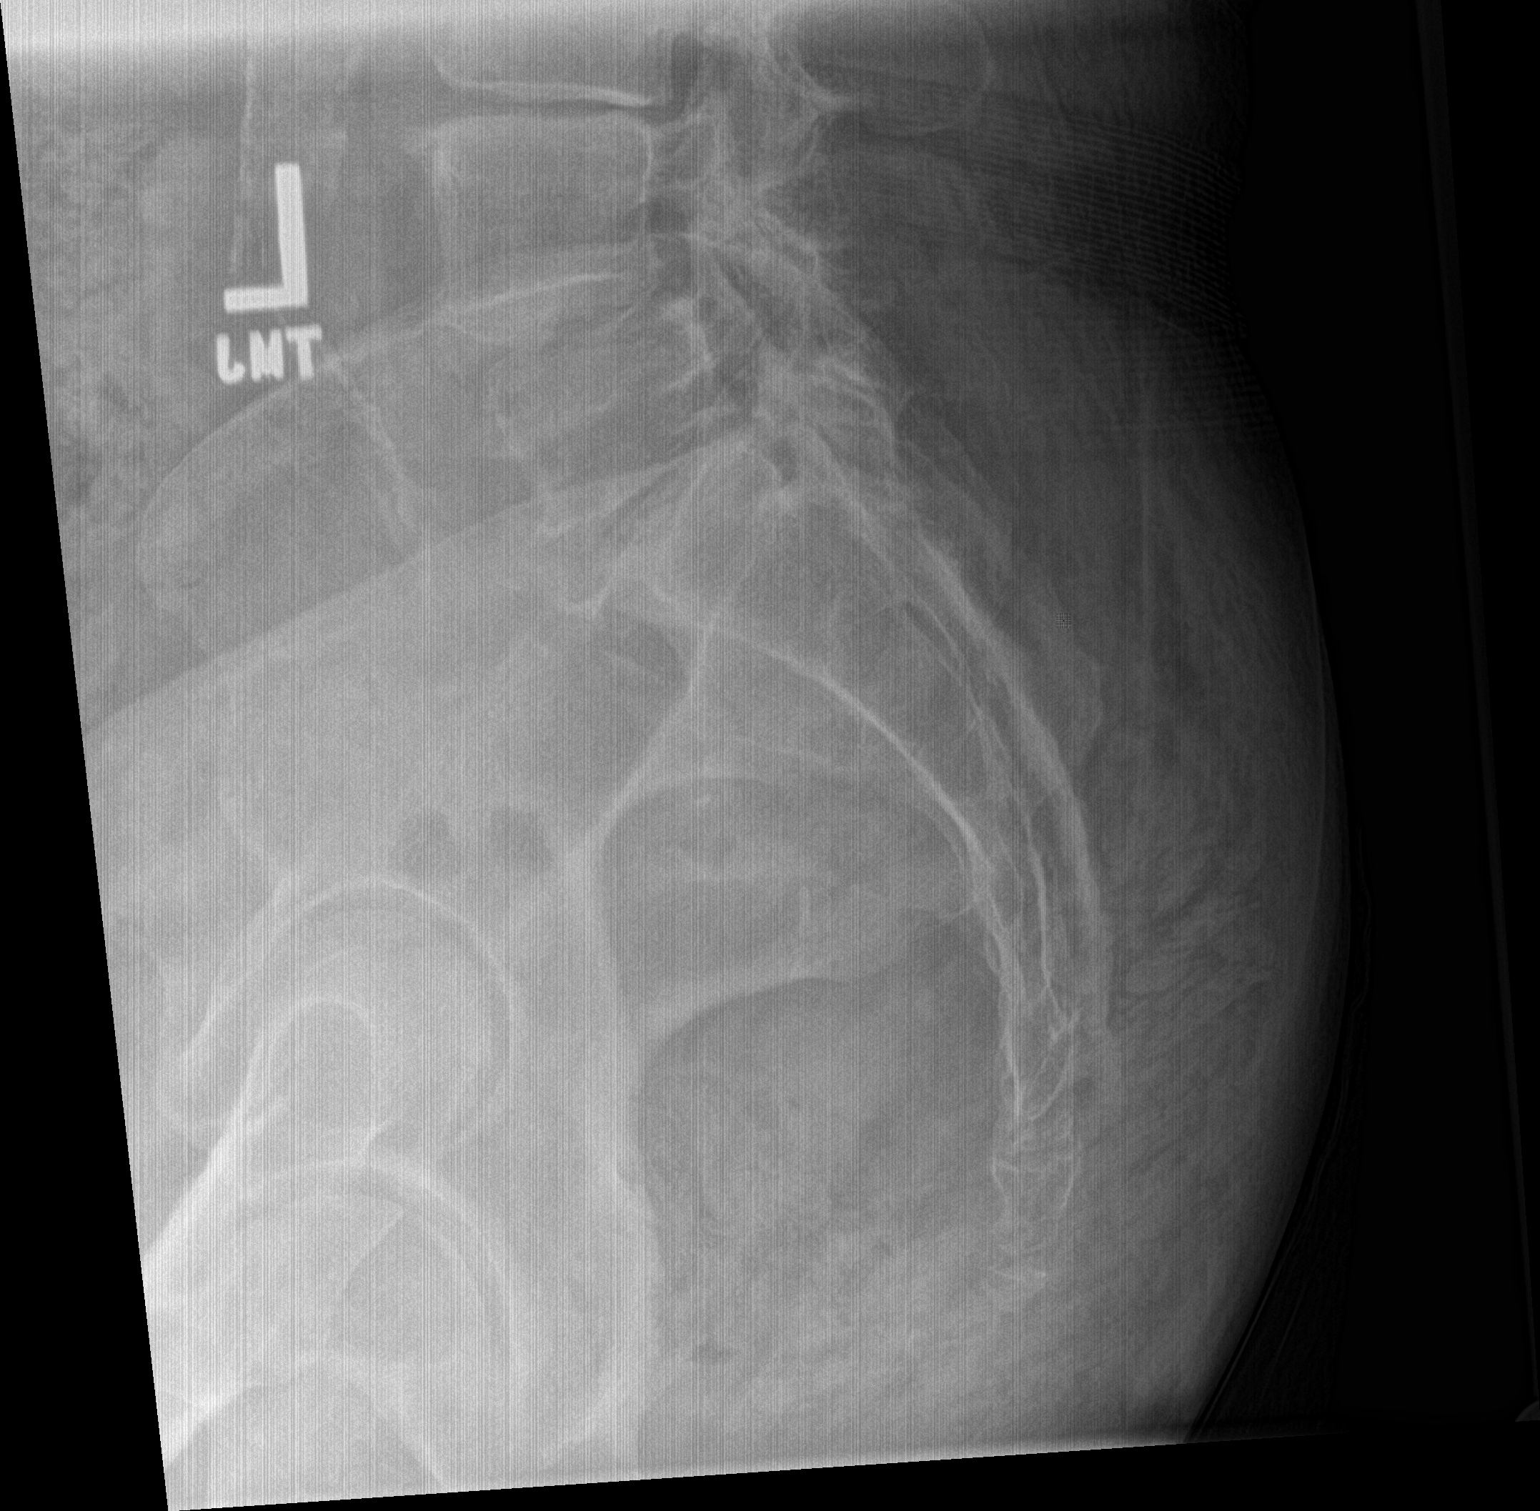

[3 of 3 positions shown; findings below may reference images not displayed]

FINDINGS: Joint space narrowing from L3-S1. Endplate osteophytosis. No
subluxation. No fracture.
IMPRESSION: 1. No acute findings lumbar spine.
2. Multilevel disc osteophytic disease not changed from comparison
CT

## 2018-06-01 IMAGING — CR DG THORACIC SPINE 2V
3 series · 3 of 3 positions shown · non-contrast
Comparison: CT chest 02/04/2015

CLINICAL DATA: Fall, back pain

EXAM:
THORACIC SPINE 2 VIEWS

[t-spine ap]
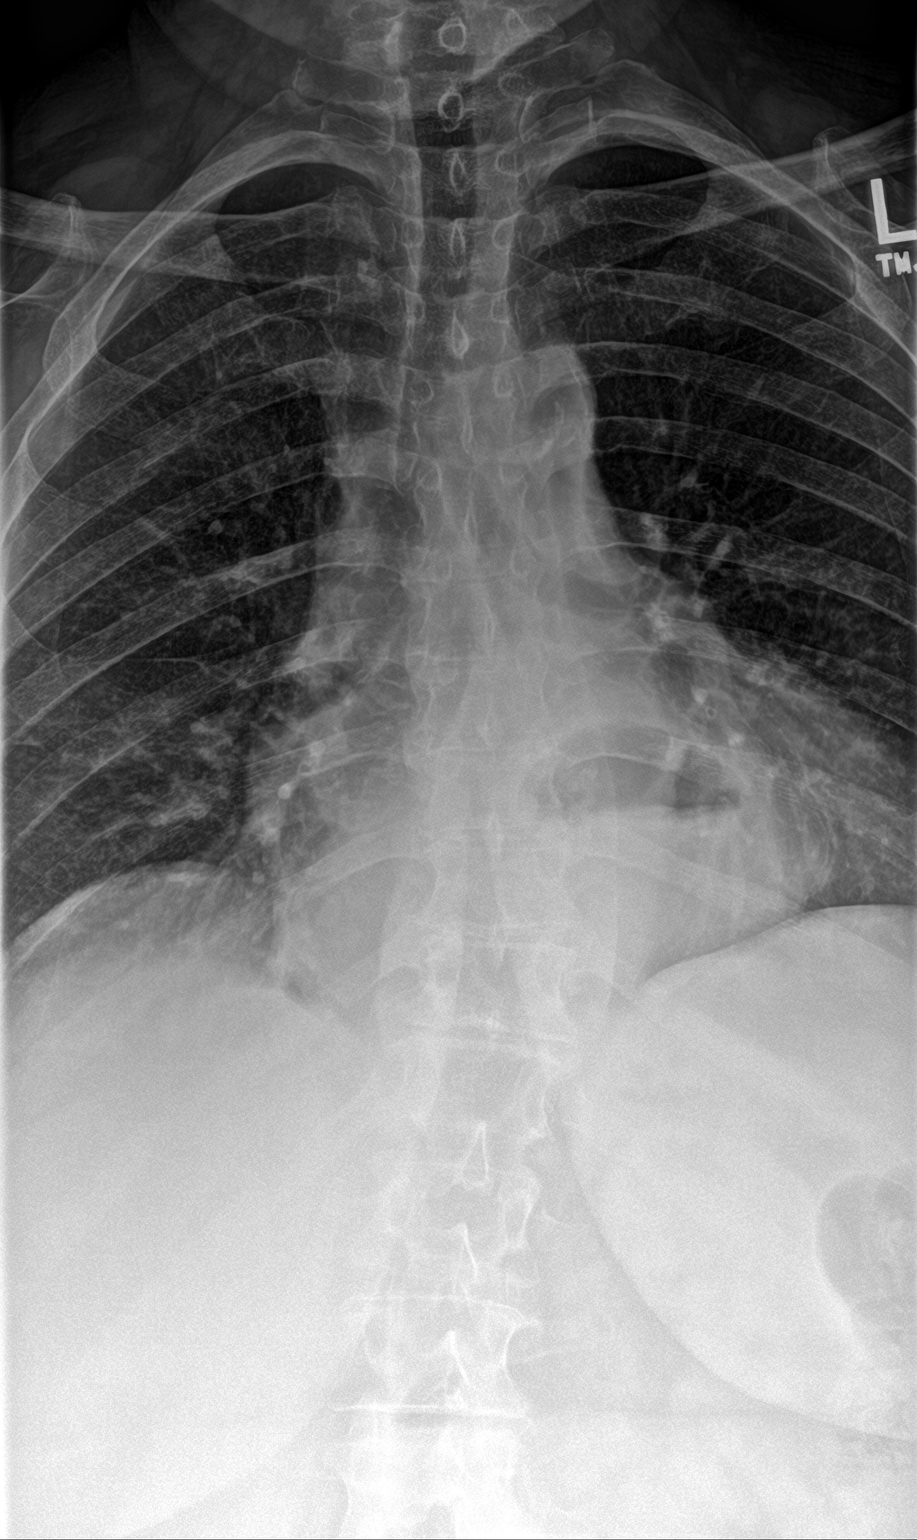

[t-spine lat]
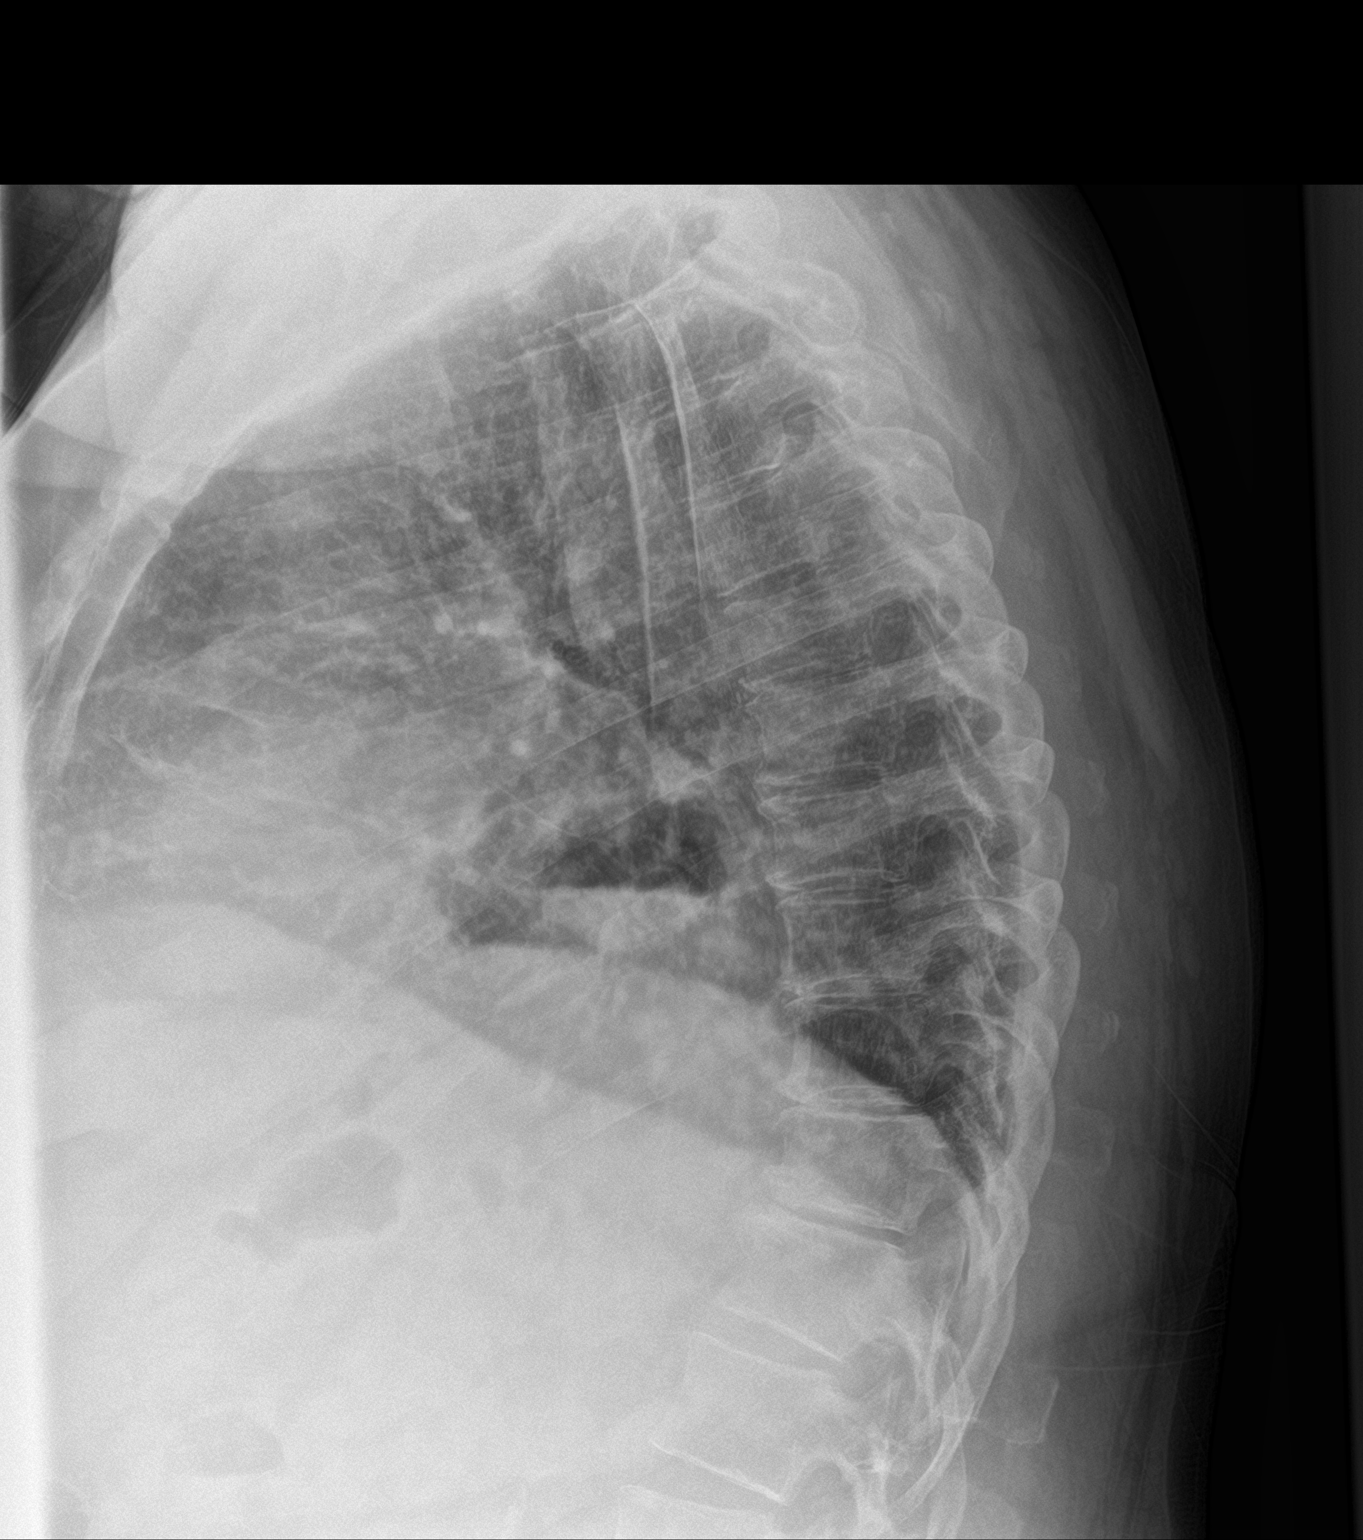

[t-spine swimmers]
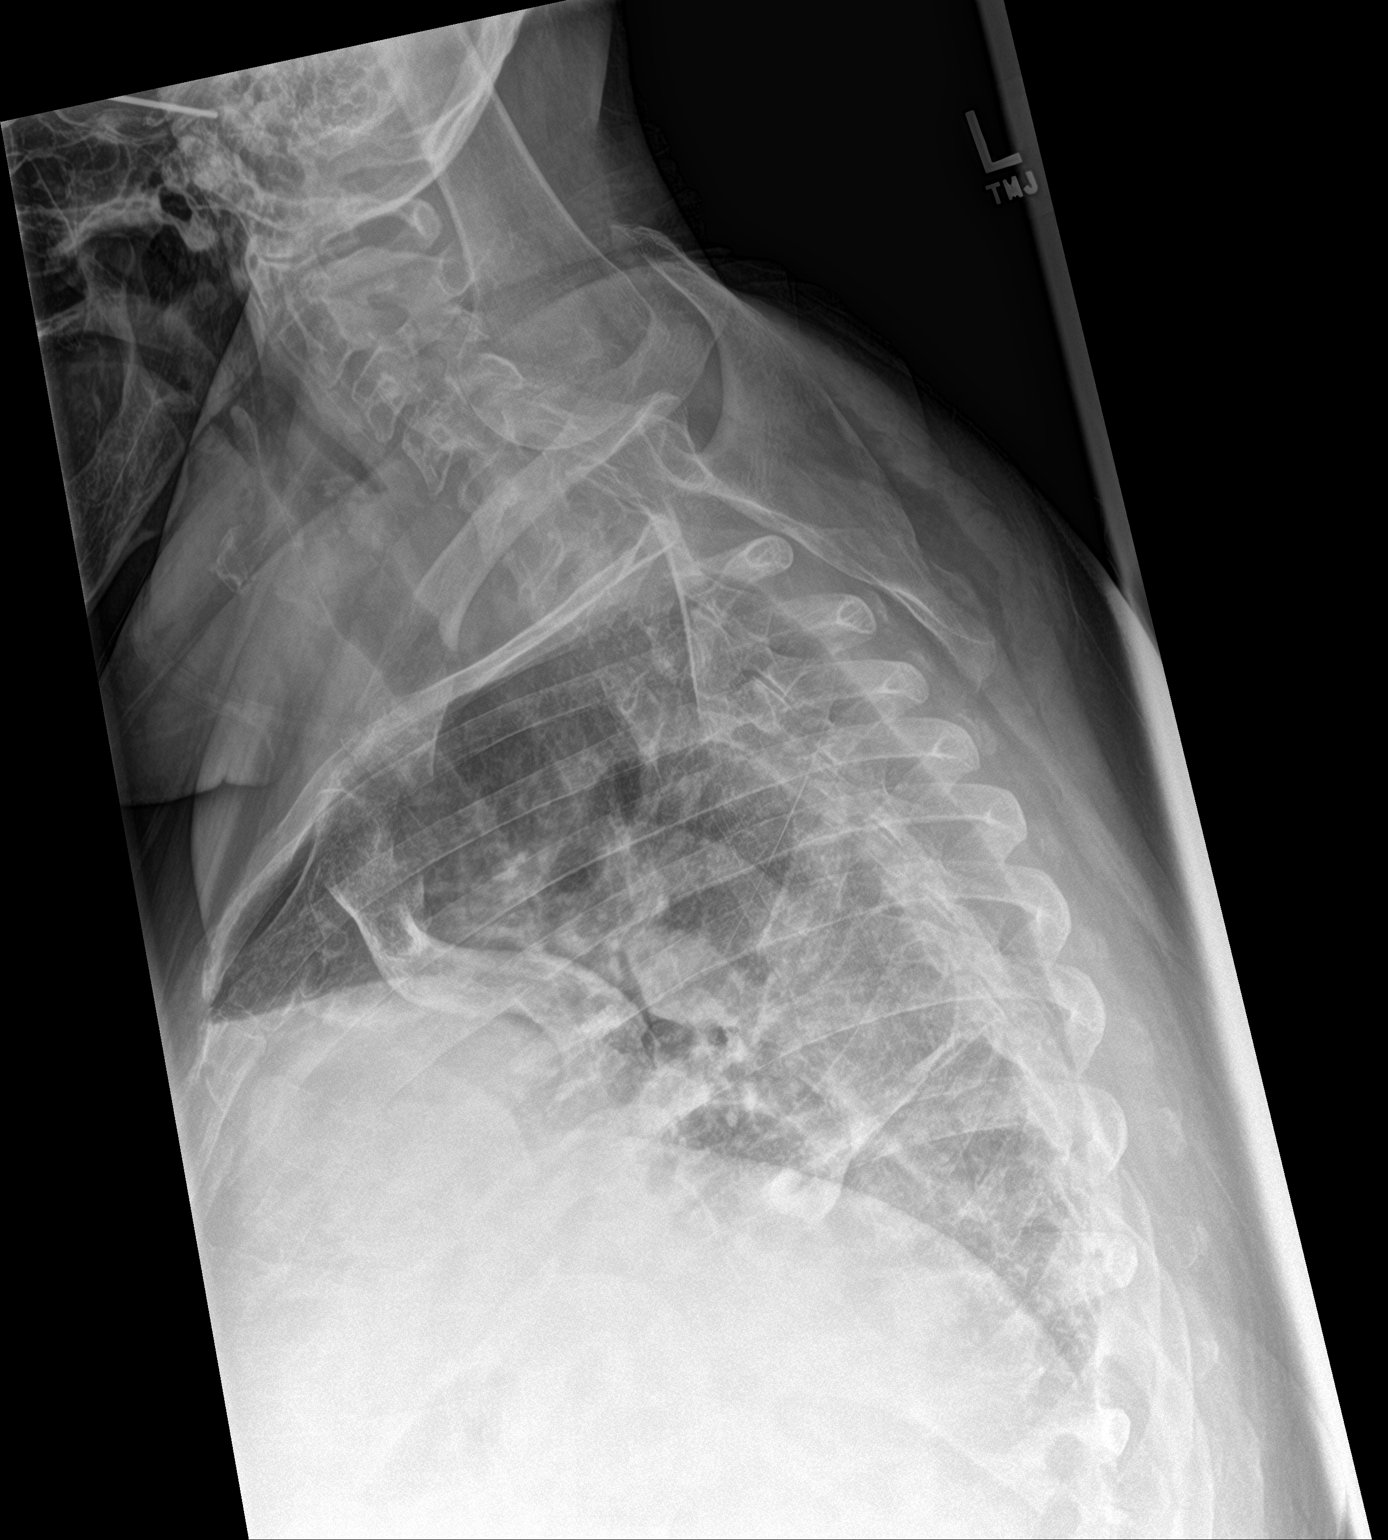

[3 of 3 positions shown; findings below may reference images not displayed]

FINDINGS: Mild leftward scoliosis in the lower thoracic spine. Mild spurring
and disc space narrowing in the lower thoracic spine. No fracture or
subluxation. Large hiatal hernia.
IMPRESSION: Mild degenerative changes and leftward scoliosis. No acute findings.

## 2018-06-10 DIAGNOSIS — M545 Low back pain, unspecified: Secondary | ICD-10-CM | POA: Insufficient documentation

## 2018-06-25 DIAGNOSIS — M7989 Other specified soft tissue disorders: Secondary | ICD-10-CM | POA: Diagnosis not present

## 2018-07-11 DIAGNOSIS — M7989 Other specified soft tissue disorders: Secondary | ICD-10-CM | POA: Diagnosis not present

## 2018-07-11 DIAGNOSIS — M79605 Pain in left leg: Secondary | ICD-10-CM | POA: Diagnosis not present

## 2018-07-11 DIAGNOSIS — I10 Essential (primary) hypertension: Secondary | ICD-10-CM | POA: Diagnosis not present

## 2018-07-11 DIAGNOSIS — Z79899 Other long term (current) drug therapy: Secondary | ICD-10-CM | POA: Diagnosis not present

## 2018-07-11 DIAGNOSIS — R0602 Shortness of breath: Secondary | ICD-10-CM | POA: Diagnosis not present

## 2018-07-12 DIAGNOSIS — R0602 Shortness of breath: Secondary | ICD-10-CM | POA: Diagnosis not present

## 2018-07-15 DIAGNOSIS — M79662 Pain in left lower leg: Secondary | ICD-10-CM | POA: Diagnosis not present

## 2018-07-15 DIAGNOSIS — M7989 Other specified soft tissue disorders: Secondary | ICD-10-CM | POA: Diagnosis not present

## 2018-07-15 DIAGNOSIS — R7989 Other specified abnormal findings of blood chemistry: Secondary | ICD-10-CM | POA: Diagnosis not present

## 2018-07-25 DIAGNOSIS — K649 Unspecified hemorrhoids: Secondary | ICD-10-CM | POA: Diagnosis not present

## 2018-07-25 DIAGNOSIS — Z79899 Other long term (current) drug therapy: Secondary | ICD-10-CM | POA: Diagnosis not present

## 2018-07-25 DIAGNOSIS — R739 Hyperglycemia, unspecified: Secondary | ICD-10-CM | POA: Diagnosis not present

## 2018-07-25 DIAGNOSIS — R5383 Other fatigue: Secondary | ICD-10-CM | POA: Diagnosis not present

## 2018-07-25 DIAGNOSIS — I10 Essential (primary) hypertension: Secondary | ICD-10-CM | POA: Diagnosis not present

## 2018-07-27 DIAGNOSIS — M79605 Pain in left leg: Secondary | ICD-10-CM | POA: Diagnosis not present

## 2018-07-27 DIAGNOSIS — M79604 Pain in right leg: Secondary | ICD-10-CM | POA: Diagnosis not present

## 2018-07-27 DIAGNOSIS — I7789 Other specified disorders of arteries and arterioles: Secondary | ICD-10-CM | POA: Diagnosis not present

## 2018-08-12 ENCOUNTER — Other Ambulatory Visit: Payer: Self-pay

## 2018-08-12 ENCOUNTER — Encounter: Payer: Self-pay | Admitting: *Deleted

## 2018-08-12 ENCOUNTER — Other Ambulatory Visit: Payer: Self-pay | Admitting: *Deleted

## 2018-08-12 ENCOUNTER — Encounter: Payer: Self-pay | Admitting: Vascular Surgery

## 2018-08-12 ENCOUNTER — Ambulatory Visit: Payer: BLUE CROSS/BLUE SHIELD | Admitting: Vascular Surgery

## 2018-08-12 VITALS — BP 132/86 | HR 70 | Temp 97.2°F | Resp 16 | Ht 65.0 in | Wt 206.0 lb

## 2018-08-12 DIAGNOSIS — I739 Peripheral vascular disease, unspecified: Secondary | ICD-10-CM | POA: Diagnosis not present

## 2018-08-12 NOTE — Progress Notes (Signed)
Vitals:   08/12/18 1357  BP: (!) 151/90  Pulse: 70  Resp: 16  Temp: (!) 97.2 F (36.2 C)  TempSrc: Oral  SpO2: 96%  Weight: 206 lb (93.4 kg)  Height: 5\' 5"  (1.651 m)

## 2018-08-12 NOTE — Progress Notes (Signed)
Patient ID: Rachel Chen, female   DOB: 21-Feb-1961, 57 y.o.   MRN: [id number]  Reason for Consult: New Patient (Initial Visit) (abnormal ABI)   Referred by Philemon Kingdom, MD  Subjective:     HPI:  Rachel Chen is a 57 y.o. female history of bilateral lower extremity pain.  She was also having swelling of her left lower extremity which led to lower extremity venous and arterial work-up.  She states when she thinks about it that may be she is overused her left lower extremity.  Studies of the left lower extremity have been negative.  She does not have wounds on either leg.  She is a lifelong smoker has tried multiple times to quit but cannot.  She also has hyperlipidemia but states that her cholesterol is well controlled.  She does not take any blood thinners.  She does take aspirin daily.  She states she can walk very short distances before she has pain occasionally has pain at night in her right foot.  Past Medical History:  Diagnosis Date  . Anxiety   . Depression   . Hypertension    History reviewed. No pertinent family history. Past Surgical History:  Procedure Laterality Date  . ABDOMINAL HYSTERECTOMY    . BACK SURGERY      Short Social History:  Social History   Tobacco Use  . Smoking status: Current Every Day Smoker    Packs/day: 1.00    Types: Cigarettes  . Smokeless tobacco: Never Used  Substance Use Topics  . Alcohol use: Yes    Comment: occassional    Allergies  Allergen Reactions  . Other     Current Outpatient Medications  Medication Sig Dispense Refill  . ALPRAZolam (XANAX) 1 MG tablet Take 1 mg by mouth at bedtime as needed for sleep.    . AmLODIPine Besylate (NORVASC PO) Take by mouth.    Marland Kitchen buPROPion (WELLBUTRIN XL) 300 MG 24 hr tablet Take 300 mg by mouth every morning.  6  . CLONIDINE HCL PO Take by mouth.    Marland Kitchen HYDROCHLOROTHIAZIDE PO Take by mouth.    Marland Kitchen LISINOPRIL PO Take by mouth.    . triamterene-hydrochlorothiazide (MAXZIDE-25)  37.5-25 MG tablet TAKE ONE TABLET EVERY DAY IN THE MORNING    . venlafaxine (EFFEXOR) 75 MG tablet Take 75 mg by mouth 2 (two) times daily.    . cyclobenzaprine (FLEXERIL) 10 MG tablet Take 1 tablet (10 mg total) by mouth at bedtime as needed for muscle spasms. (Patient not taking: Reported on 08/12/2018) 10 tablet 0  . HYDROcodone-acetaminophen (NORCO) 5-325 MG tablet Take 1-2 tablets by mouth every 6 (six) hours as needed for moderate pain or severe pain. (Patient not taking: Reported on 08/12/2018) 15 tablet 0  . Nebivolol HCl (BYSTOLIC PO) Take by mouth.    . oxyCODONE-acetaminophen (PERCOCET/ROXICET) 5-325 MG per tablet Take 2 tablets by mouth every 4 (four) hours as needed for pain. (Patient not taking: Reported on 08/12/2018) 15 tablet 0   No current facility-administered medications for this visit.     Review of Systems  Constitutional:  Constitutional negative. HENT: HENT negative.  Eyes: Eyes negative.  Respiratory: Positive for shortness of breath and wheezing.  Cardiovascular: Positive for leg swelling.  GI: Gastrointestinal negative.  Musculoskeletal: Positive for leg pain and joint pain.  Skin: Skin negative.  Neurological: Positive for numbness.  Hematologic: Hematologic/lymphatic negative.  Psychiatric: Psychiatric negative.        Objective:  Objective   Vitals:  08/12/18 1357 08/12/18 1404  BP: (!) 151/90 132/86  Pulse: 70 70  Resp: 16   Temp: (!) 97.2 F (36.2 C)   TempSrc: Oral   SpO2: 96%   Weight: 206 lb (93.4 kg)   Height: 5\' 5"  (1.651 m)    Body mass index is 34.28 kg/m.  Physical Exam  Constitutional: She is oriented to person, place, and time. She appears well-developed.  HENT:  Head: Normocephalic.  Eyes: Pupils are equal, round, and reactive to light.  Neck: Normal range of motion.  Cardiovascular: Normal rate.  Murmur heard.  Systolic murmur is present. Pulses:      Carotid pulses are 2+ on the right side, and 2+ on the left side. No  carotid bruits Very weak monophasic dorsalis pedis on the right,  Pulmonary/Chest: No respiratory distress. She has wheezes.  Abdominal: Soft. She exhibits no mass.  Musculoskeletal: Normal range of motion. She exhibits edema.  Neurological: She is alert and oriented to person, place, and time.  Skin: Skin is warm and dry.  Psychiatric: She has a normal mood and affect. Her behavior is normal. Judgment and thought content normal.    Data: Outside ABIs are 0.4 right and 0.9 left. She has venous Dopplers which demonstrates no evidence of DVT.     Assessment/Plan:     57 year old female presents with bilateral lower extremity pain right greater than left with severe depression of her right-sided blood flow.  This is evident on physical exam as well.  We will begin with aortogram with bilateral lower extremity runoff possible intervention on the right.  I have urged her to quit smoking and to take aspirin daily.  She demonstrates good understanding we will get her scheduled today.     Maeola HarmanBrandon Christopher Cain MD [facility]

## 2018-08-14 ENCOUNTER — Encounter: Payer: Self-pay | Admitting: Internal Medicine

## 2018-08-26 ENCOUNTER — Encounter (HOSPITAL_COMMUNITY)
Admission: RE | Disposition: A | Discharge status: Home / Self Care | Payer: Self-pay | Source: Ambulatory Visit | Attending: Vascular Surgery

## 2018-08-26 ENCOUNTER — Telehealth: Payer: Self-pay | Admitting: Vascular Surgery

## 2018-08-26 ENCOUNTER — Ambulatory Visit (HOSPITAL_COMMUNITY)
Admission: RE | Admit: 2018-08-26 | Discharge: 2018-08-26 | Disposition: A | Discharge status: Home / Self Care | Payer: BLUE CROSS/BLUE SHIELD | Source: Ambulatory Visit | Attending: Vascular Surgery | Admitting: Vascular Surgery

## 2018-08-26 DIAGNOSIS — F17210 Nicotine dependence, cigarettes, uncomplicated: Secondary | ICD-10-CM | POA: Insufficient documentation

## 2018-08-26 DIAGNOSIS — Z79899 Other long term (current) drug therapy: Secondary | ICD-10-CM | POA: Diagnosis not present

## 2018-08-26 DIAGNOSIS — I998 Other disorder of circulatory system: Secondary | ICD-10-CM | POA: Diagnosis not present

## 2018-08-26 DIAGNOSIS — Z90710 Acquired absence of both cervix and uterus: Secondary | ICD-10-CM | POA: Insufficient documentation

## 2018-08-26 DIAGNOSIS — F419 Anxiety disorder, unspecified: Secondary | ICD-10-CM | POA: Diagnosis not present

## 2018-08-26 DIAGNOSIS — F329 Major depressive disorder, single episode, unspecified: Secondary | ICD-10-CM | POA: Diagnosis not present

## 2018-08-26 DIAGNOSIS — Z9889 Other specified postprocedural states: Secondary | ICD-10-CM | POA: Diagnosis not present

## 2018-08-26 DIAGNOSIS — Z9109 Other allergy status, other than to drugs and biological substances: Secondary | ICD-10-CM | POA: Insufficient documentation

## 2018-08-26 DIAGNOSIS — I70221 Atherosclerosis of native arteries of extremities with rest pain, right leg: Secondary | ICD-10-CM | POA: Insufficient documentation

## 2018-08-26 DIAGNOSIS — I10 Essential (primary) hypertension: Secondary | ICD-10-CM | POA: Diagnosis not present

## 2018-08-26 HISTORY — PX: LOWER EXTREMITY ANGIOGRAPHY: CATH118251

## 2018-08-26 HISTORY — PX: PERIPHERAL VASCULAR INTERVENTION: CATH118257

## 2018-08-26 LAB — POCT I-STAT, CHEM 8
BUN: 12 mg/dL (ref 6–20)
CALCIUM ION: 1.15 mmol/L (ref 1.15–1.40)
Chloride: 103 mmol/L (ref 98–111)
Creatinine, Ser: 0.7 mg/dL (ref 0.44–1.00)
Glucose, Bld: 116 mg/dL — ABNORMAL HIGH (ref 70–99)
HEMATOCRIT: 37 % (ref 36.0–46.0)
HEMOGLOBIN: 12.6 g/dL (ref 12.0–15.0)
Potassium: 3.9 mmol/L (ref 3.5–5.1)
Sodium: 139 mmol/L (ref 135–145)
TCO2: 26 mmol/L (ref 22–32)

## 2018-08-26 SURGERY — LOWER EXTREMITY ANGIOGRAPHY
Anesthesia: LOCAL | Laterality: Right

## 2018-08-26 MED ORDER — LIDOCAINE HCL (PF) 1 % IJ SOLN
INTRAMUSCULAR | Status: AC
  Filled 2018-08-26: qty 30

## 2018-08-26 MED ORDER — MIDAZOLAM HCL 2 MG/2ML IJ SOLN
INTRAMUSCULAR | Status: DC | PRN
  Administered 2018-08-26 (×3): 1 mg via INTRAVENOUS

## 2018-08-26 MED ORDER — ONDANSETRON HCL 4 MG/2ML IJ SOLN: 4.0000 mg | Freq: Four times a day (QID) | INTRAMUSCULAR | Status: DC | PRN

## 2018-08-26 MED ORDER — CLOPIDOGREL BISULFATE 75 MG PO TABS: 75.0000 mg | ORAL_TABLET | Freq: Every day | ORAL | 3 refills | Status: DC

## 2018-08-26 MED ORDER — LIDOCAINE HCL (PF) 1 % IJ SOLN
INTRAMUSCULAR | Status: DC | PRN
  Administered 2018-08-26 (×2): 20 mL

## 2018-08-26 MED ORDER — SODIUM CHLORIDE 0.9% FLUSH: 3.0000 mL | INTRAVENOUS | Status: DC | PRN

## 2018-08-26 MED ORDER — FENTANYL CITRATE (PF) 100 MCG/2ML IJ SOLN
INTRAMUSCULAR | Status: DC | PRN
  Administered 2018-08-26 (×2): 50 ug via INTRAVENOUS

## 2018-08-26 MED ORDER — FENTANYL CITRATE (PF) 100 MCG/2ML IJ SOLN
INTRAMUSCULAR | Status: AC
  Filled 2018-08-26: qty 2

## 2018-08-26 MED ORDER — HEPARIN (PORCINE) IN NACL 1000-0.9 UT/500ML-% IV SOLN
INTRAVENOUS | Status: AC
  Filled 2018-08-26: qty 500

## 2018-08-26 MED ORDER — HEPARIN SODIUM (PORCINE) 1000 UNIT/ML IJ SOLN
INTRAMUSCULAR | Status: DC | PRN
  Administered 2018-08-26: 9000 [IU] via INTRAVENOUS

## 2018-08-26 MED ORDER — SODIUM CHLORIDE 0.9 % IV SOLN
INTRAVENOUS | Status: DC
  Administered 2018-08-26: 12:00:00 via INTRAVENOUS

## 2018-08-26 MED ORDER — CLOPIDOGREL BISULFATE 75 MG PO TABS
300.0000 mg | ORAL_TABLET | Freq: Once | ORAL | Status: AC
  Administered 2018-08-26: 300 mg via ORAL

## 2018-08-26 MED ORDER — SODIUM CHLORIDE 0.9 % WEIGHT BASED INFUSION: 1.0000 mL/kg/h | INTRAVENOUS | Status: DC

## 2018-08-26 MED ORDER — MIDAZOLAM HCL 2 MG/2ML IJ SOLN
INTRAMUSCULAR | Status: AC
  Filled 2018-08-26: qty 2

## 2018-08-26 MED ORDER — CLOPIDOGREL BISULFATE 75 MG PO TABS: 75.0000 mg | ORAL_TABLET | Freq: Every day | ORAL | Status: DC

## 2018-08-26 MED ORDER — HYDRALAZINE HCL 20 MG/ML IJ SOLN: 5.0000 mg | INTRAMUSCULAR | Status: DC | PRN

## 2018-08-26 MED ORDER — SODIUM CHLORIDE 0.9 % IV SOLN: 250.0000 mL | INTRAVENOUS | Status: DC | PRN

## 2018-08-26 MED ORDER — OXYCODONE HCL 5 MG PO TABS: 5.0000 mg | ORAL_TABLET | ORAL | Status: DC | PRN

## 2018-08-26 MED ORDER — MORPHINE SULFATE (PF) 10 MG/ML IV SOLN: 2.0000 mg | INTRAVENOUS | Status: DC | PRN

## 2018-08-26 MED ORDER — HEPARIN (PORCINE) IN NACL 1000-0.9 UT/500ML-% IV SOLN
INTRAVENOUS | Status: DC | PRN
  Administered 2018-08-26 (×2): 500 mL

## 2018-08-26 MED ORDER — LABETALOL HCL 5 MG/ML IV SOLN: 10.0000 mg | INTRAVENOUS | Status: DC | PRN

## 2018-08-26 MED ORDER — IODIXANOL 320 MG/ML IV SOLN
INTRAVENOUS | Status: DC | PRN
  Administered 2018-08-26: 170 mL via INTRA_ARTERIAL

## 2018-08-26 MED ORDER — SODIUM CHLORIDE 0.9% FLUSH: 3.0000 mL | Freq: Two times a day (BID) | INTRAVENOUS | Status: DC

## 2018-08-26 MED ORDER — CLOPIDOGREL BISULFATE 300 MG PO TABS
ORAL_TABLET | ORAL | Status: AC
  Filled 2018-08-26: qty 1

## 2018-08-26 MED ORDER — HEPARIN SODIUM (PORCINE) 1000 UNIT/ML IJ SOLN
INTRAMUSCULAR | Status: AC
  Filled 2018-08-26: qty 1

## 2018-08-26 MED ORDER — ACETAMINOPHEN 325 MG PO TABS: 650.0000 mg | ORAL_TABLET | ORAL | Status: DC | PRN

## 2018-08-26 SURGICAL SUPPLY — 17 items
CATH ANGIO 5F BER2 65CM (CATHETERS) ×3 IMPLANT
CATH OMNI FLUSH 5F 65CM (CATHETERS) ×3 IMPLANT
DEVICE CLOSURE MYNXGRIP 5F (Vascular Products) ×3 IMPLANT
DEVICE CLOSURE MYNXGRIP 6/7F (Vascular Products) ×3 IMPLANT
GUIDEWIRE ANGLED .035X150CM (WIRE) ×3 IMPLANT
KIT ENCORE 26 ADVANTAGE (KITS) ×3 IMPLANT
KIT MICROPUNCTURE NIT STIFF (SHEATH) ×3 IMPLANT
KIT PV (KITS) ×3 IMPLANT
SHEATH BRITE TIP 7FR 35CM (SHEATH) ×3 IMPLANT
SHEATH PINNACLE 5F 10CM (SHEATH) ×6 IMPLANT
SHEATH PINNACLE 7F 10CM (SHEATH) ×3 IMPLANT
SHEATH PROBE COVER 6X72 (BAG) ×6 IMPLANT
STENT VIABAHNBX 8X59X80 (Permanent Stent) ×3 IMPLANT
SYR MEDRAD MARK V 150ML (SYRINGE) ×3 IMPLANT
TRANSDUCER W/STOPCOCK (MISCELLANEOUS) ×3 IMPLANT
TRAY PV CATH (CUSTOM PROCEDURE TRAY) ×3 IMPLANT
WIRE BENTSON .035X145CM (WIRE) ×3 IMPLANT

## 2018-08-26 NOTE — Discharge Instructions (Signed)
Femoral Site Care °Refer to this sheet in the next few weeks. These instructions provide you with information about caring for yourself after your procedure. Your health care provider may also give you more specific instructions. Your treatment has been planned according to current medical practices, but problems sometimes occur. Call your health care provider if you have any problems or questions after your procedure. °What can I expect after the procedure? °After your procedure, it is typical to have the following: °· Bruising at the site that usually fades within 1-2 weeks. °· Blood collecting in the tissue (hematoma) that may be painful to the touch. It should usually decrease in size and tenderness within 1-2 weeks. ° °Follow these instructions at home: °· Take medicines only as directed by your health care provider. °· You may shower 24-48 hours after the procedure or as directed by your health care provider. Remove the bandage (dressing) and gently wash the site with plain soap and water. Pat the area dry with a clean towel. Do not rub the site, because this may cause bleeding. °· Do not take baths, swim, or use a hot tub until your health care provider approves. °· Check your insertion site every day for redness, swelling, or drainage. °· Do not apply powder or lotion to the site. °· Limit use of stairs to twice a day for the first 2-3 days or as directed by your health care provider. °· Do not squat for the first 2-3 days or as directed by your health care provider. °· Do not lift over 10 lb (4.5 kg) for 5 days after your procedure or as directed by your health care provider. °· Ask your health care provider when it is okay to: °? Return to work or school. °? Resume usual physical activities or sports. °? Resume sexual activity. °· Do not drive home if you are discharged the same day as the procedure. Have someone else drive you. °· You may drive 24 hours after the procedure unless otherwise instructed by  your health care provider. °· Do not operate machinery or power tools for 24 hours after the procedure or as directed by your health care provider. °· If your procedure was done as an outpatient procedure, which means that you went home the same day as your procedure, a responsible adult should be with you for the first 24 hours after you arrive home. °· Keep all follow-up visits as directed by your health care provider. This is important. °Contact a health care provider if: °· You have a fever. °· You have chills. °· You have increased bleeding from the site. Hold pressure on the site. °Get help right away if: °· You have unusual pain at the site. °· You have redness, warmth, or swelling at the site. °· You have drainage (other than a small amount of blood on the dressing) from the site. °· The site is bleeding, and the bleeding does not stop after 30 minutes of holding steady pressure on the site. °· Your leg or foot becomes pale, cool, tingly, or numb. °This information is not intended to replace advice given to you by your health care provider. Make sure you discuss any questions you have with your health care provider. °Document Released: 08/07/2014 Document Revised: 05/11/2016 Document Reviewed: 06/23/2014 °Elsevier Interactive Patient Education © 2018 [employer] °Moderate Conscious Sedation, Adult, Care After °These instructions provide you with information about caring for yourself after your procedure. Your health care provider may also give you more   specific instructions. Your treatment has been planned according to current medical practices, but problems sometimes occur. Call your health care provider if you have any problems or questions after your procedure. °What can I expect after the procedure? °After your procedure, it is common: °· To feel sleepy for several hours. °· To feel clumsy and have poor balance for several hours. °· To have poor judgment for several hours. °· To vomit if you eat too  soon. ° °Follow these instructions at home: °For at least 24 hours after the procedure: ° °· Do not: °? Participate in activities where you could fall or become injured. °? Drive. °? Use heavy machinery. °? Drink alcohol. °? Take sleeping pills or medicines that cause drowsiness. °? Make important decisions or sign legal documents. °? Take care of children on your own. °· Rest. °Eating and drinking °· Follow the diet recommended by your health care provider. °· If you vomit: °? Drink water, juice, or soup when you can drink without vomiting. °? Make sure you have little or no nausea before eating solid foods. °General instructions °· Have a responsible adult stay with you until you are awake and alert. °· Take over-the-counter and prescription medicines only as told by your health care provider. °· If you smoke, do not smoke without supervision. °· Keep all follow-up visits as told by your health care provider. This is important. °Contact a health care provider if: °· You keep feeling nauseous or you keep vomiting. °· You feel light-headed. °· You develop a rash. °· You have a fever. °Get help right away if: °· You have trouble breathing. °This information is not intended to replace advice given to you by your health care provider. Make sure you discuss any questions you have with your health care provider. °Document Released: 09/24/2013 Document Revised: 05/08/2016 Document Reviewed: 03/25/2016 °Elsevier Interactive Patient Education © 2018 [employer] ° °

## 2018-08-26 NOTE — Progress Notes (Signed)
Client up and walked and tolerated well bilat groins stable no bleeding or hematoma 

## 2018-08-26 NOTE — Progress Notes (Signed)
Client c/o right 3rd toe pain states broke toe about 6 wks ago; right 3rd toe swollen and black color; Dr Randie Heinz notified client wants him to look at her toe

## 2018-08-26 NOTE — Op Note (Signed)
    Patient name: Rachel Chen MRN: [id number] DOB: 01/03/1961 Sex: female  08/26/2018 Pre-operative Diagnosis: Critical right lower extremity ischemia with severely depressed ABI Post-operative diagnosis:  Same Surgeon:  Luanna Salk. Randie Heinz, MD Procedure Performed: 1.  Ultrasound guided cannulation left common femoral artery 2.  Aortogram bilateral lower extremity runoff 3.  Ultrasound-guided cannulation right common femoral artery 4.  Stent of right common iliac artery with 8 x 59 VBX 5.  Minx closure device of bilateral common femoral arteries 6.  Moderate sedation with fentanyl Versed for 62 minutes  Indications: 57 year old female with long history of smoking now has pain in her right lower extremity and is stubbed her toe with discoloration of the right second toe.  Her ABIs 0.4 on the right 0.9 on the left.  She is okay to for angiogram possible intervention on the right side.  Findings: The aorta had a distal calcified plaque but was non-flow-limiting.  The common iliac artery on the right was occluded.  She appeared to have runoff down both legs at least via the posterior tibial artery.  On the left the anterior tibial artery was a variant on the right it appeared to be normal at anatomic position.  After stenting of the right common iliac artery there is no further stenosis were once it was occluded.  There is no dissection there and the hypogastric artery still feels on the right side.  She had a palpable posterior tibial pulse at the completion of procedure.   Procedure:  The patient was identified in the holding area and taken to room 8.  The patient was then placed supine on the table and prepped and draped in the usual sterile fashion.  A time out was called.  Ultrasound was used to evaluate the left common femoral artery which was noted to be patent this was cannulated with micropuncture needle under direct ultrasound guidance.  An image was saved the permanent record.  We placed a  micropuncture sheath followed by Bentson wire and 5 French sheath.  We then placed an Omni catheter performed aortogram with bilateral lower extremity runoff with the above findings.  We then used ultrasound guidance to cannulate the right common femoral artery with direct visualization.  The image was saved the permanent record.  A Bentson wire was placed through the micropuncture sheath followed by 5 French sheath.  Patient was fully heparinized with 9000 units of heparin.  We used a bare catheter and Glidewire to traverse the common iliac artery.  We then confirmed intraluminal access.  We exchanged back for a Bentson wire.  We then twice a long 7 French sheath through the occluded segment.  We then brought our 8 x 59 VBX into place performed aortogram and deployed the stent.  Completion angiography demonstrated no further residual stenosis or dissection.  We then exchanged for a short 7 [place] sheath.  Minx device were deployed bilateral common femoral arteries.  These did take well.  She tolerated this procedure well without immediate comp occasion.  Next  Contrast 170 cc.   Victorino Fatzinger C. Randie Heinz, MD [facility] Office: [phone] Pager: [phone]

## 2018-08-26 NOTE — Telephone Encounter (Signed)
sch appt lvm mld ltr 10/11/18 8am Aorto-iliac 9am ABI 945am f/u MD

## 2018-08-26 NOTE — H&P (Signed)
   History and Physical Update  The patient was interviewed and re-examined.  The patient's previous History and Physical has been reviewed and is unchanged from recent office visit. Plan for aortogram with intervention on the right.   Aayana Reinertsen C. Randie Heinz, MD [facility] [place] Office: [phone] Pager: [phone]  08/26/2018, 12:23 PM

## 2018-08-27 ENCOUNTER — Encounter (HOSPITAL_COMMUNITY): Payer: Self-pay | Admitting: Vascular Surgery

## 2018-08-28 ENCOUNTER — Other Ambulatory Visit: Payer: Self-pay

## 2018-08-28 DIAGNOSIS — I739 Peripheral vascular disease, unspecified: Secondary | ICD-10-CM

## 2018-08-28 DIAGNOSIS — I998 Other disorder of circulatory system: Secondary | ICD-10-CM

## 2018-08-28 DIAGNOSIS — I70229 Atherosclerosis of native arteries of extremities with rest pain, unspecified extremity: Secondary | ICD-10-CM

## 2018-10-11 ENCOUNTER — Encounter (HOSPITAL_COMMUNITY): Payer: BLUE CROSS/BLUE SHIELD

## 2018-10-11 ENCOUNTER — Ambulatory Visit: Payer: BLUE CROSS/BLUE SHIELD | Admitting: Vascular Surgery

## 2018-11-29 ENCOUNTER — Encounter: Payer: Self-pay | Admitting: *Deleted

## 2018-12-06 ENCOUNTER — Encounter: Payer: Self-pay | Admitting: Vascular Surgery

## 2018-12-06 ENCOUNTER — Encounter (HOSPITAL_COMMUNITY): Payer: BLUE CROSS/BLUE SHIELD

## 2018-12-06 ENCOUNTER — Inpatient Hospital Stay (HOSPITAL_COMMUNITY): Admission: RE | Admit: 2018-12-06 | Payer: BLUE CROSS/BLUE SHIELD | Source: Ambulatory Visit

## 2018-12-06 ENCOUNTER — Ambulatory Visit: Payer: BLUE CROSS/BLUE SHIELD | Admitting: Vascular Surgery

## 2018-12-09 ENCOUNTER — Encounter: Payer: Self-pay | Admitting: Vascular Surgery

## 2018-12-18 HISTORY — PX: HEMORROIDECTOMY: SUR656

## 2018-12-19 ENCOUNTER — Other Ambulatory Visit (HOSPITAL_COMMUNITY): Payer: Self-pay | Admitting: Vascular Surgery

## 2018-12-24 DIAGNOSIS — Z79899 Other long term (current) drug therapy: Secondary | ICD-10-CM | POA: Diagnosis not present

## 2018-12-24 DIAGNOSIS — E785 Hyperlipidemia, unspecified: Secondary | ICD-10-CM | POA: Diagnosis not present

## 2018-12-24 DIAGNOSIS — N390 Urinary tract infection, site not specified: Secondary | ICD-10-CM | POA: Diagnosis not present

## 2018-12-24 DIAGNOSIS — R739 Hyperglycemia, unspecified: Secondary | ICD-10-CM | POA: Diagnosis not present

## 2018-12-24 DIAGNOSIS — I10 Essential (primary) hypertension: Secondary | ICD-10-CM | POA: Diagnosis not present

## 2019-01-10 ENCOUNTER — Telehealth (HOSPITAL_COMMUNITY): Payer: Self-pay | Admitting: Surgery

## 2019-01-10 NOTE — Telephone Encounter (Signed)
Attempted to contact patient to remind her of appointments scheduled 01/17/2019

## 2019-01-17 ENCOUNTER — Encounter: Payer: Self-pay | Admitting: Vascular Surgery

## 2019-01-17 ENCOUNTER — Ambulatory Visit (INDEPENDENT_AMBULATORY_CARE_PROVIDER_SITE_OTHER): Payer: BLUE CROSS/BLUE SHIELD | Admitting: Vascular Surgery

## 2019-01-17 ENCOUNTER — Ambulatory Visit (INDEPENDENT_AMBULATORY_CARE_PROVIDER_SITE_OTHER)
Admission: RE | Admit: 2019-01-17 | Discharge: 2019-01-17 | Disposition: A | Discharge status: Home / Self Care | Payer: BLUE CROSS/BLUE SHIELD | Source: Ambulatory Visit | Attending: Vascular Surgery | Admitting: Vascular Surgery

## 2019-01-17 ENCOUNTER — Other Ambulatory Visit: Payer: Self-pay

## 2019-01-17 ENCOUNTER — Ambulatory Visit (HOSPITAL_COMMUNITY)
Admission: RE | Admit: 2019-01-17 | Discharge: 2019-01-17 | Disposition: A | Discharge status: Home / Self Care | Payer: BLUE CROSS/BLUE SHIELD | Source: Ambulatory Visit | Attending: Vascular Surgery | Admitting: Vascular Surgery

## 2019-01-17 VITALS — BP 148/96 | HR 77 | Temp 97.5°F | Resp 20 | Ht 65.0 in | Wt 204.0 lb

## 2019-01-17 DIAGNOSIS — I739 Peripheral vascular disease, unspecified: Secondary | ICD-10-CM

## 2019-01-17 DIAGNOSIS — I998 Other disorder of circulatory system: Secondary | ICD-10-CM | POA: Diagnosis not present

## 2019-01-17 DIAGNOSIS — I70229 Atherosclerosis of native arteries of extremities with rest pain, unspecified extremity: Secondary | ICD-10-CM

## 2019-01-17 NOTE — Progress Notes (Signed)
Patient ID: Rachel LambertDonna M Bick, female   DOB: [birth year], 58 y.o.   MRN: [id number]  Reason for Consult: Follow-up (ABI/aorta-iliac)   Referred by Philemon KingdomProchnau, Caroline, MD  Subjective:     HPI:  Rachel Chen is a 58 y.o. female follows up for previous right common iliac artery stent.  She is taking her aspirin and Plavix as prescribed.  She continues to smoke although she is down to 3 cigarettes daily.  [place].  Not having any issues with her leg at this time.  Prior to the procedure she had stubbed her right second toe and had dusky discoloration but that is all resolved at this time.  She has no tissue loss or ulceration at this time.  She denies any claudication.  Past Medical History:  Diagnosis Date  . Anxiety   . Depression   . Hypertension    History reviewed. No pertinent family history. Past Surgical History:  Procedure Laterality Date  . ABDOMINAL HYSTERECTOMY    . BACK SURGERY    . LOWER EXTREMITY ANGIOGRAPHY N/A 08/26/2018   Procedure: LOWER EXTREMITY ANGIOGRAPHY;  Surgeon: Maeola Harmanain, Marisabel Macpherson Christopher, MD;  [facility];  Service: Cardiovascular;  Laterality: N/A;  . PERIPHERAL VASCULAR INTERVENTION Right 08/26/2018   Procedure: PERIPHERAL VASCULAR INTERVENTION;  Surgeon: Maeola Harmanain, Nazly Digilio Christopher, MD;  [facility];  Service: Cardiovascular;  Laterality: Right;  Common iliac    Short Social History:  Social History   Tobacco Use  . Smoking status: Current Every Day Smoker    Packs/day: 1.00    Types: Cigarettes  . Smokeless tobacco: Never Used  Substance Use Topics  . Alcohol use: Yes    Comment: occassional    No Known Allergies  Current Outpatient Medications  Medication Sig Dispense Refill  . ALPRAZolam (XANAX) 1 MG tablet Take 1 mg by mouth 3 (three) times daily.     Marland Kitchen. amitriptyline (ELAVIL) 25 MG tablet Take 25 mg by mouth at bedtime.    Marland Kitchen. amLODipine (NORVASC) 10 MG  tablet Take 10 mg by mouth daily.     . ARIPiprazole (ABILIFY) 2 MG tablet Take 2 mg by mouth daily.    Marland Kitchen. aspirin EC 81 MG tablet Take 81 mg by mouth daily.    Marland Kitchen. buPROPion (WELLBUTRIN XL) 300 MG 24 hr tablet Take 300 mg by mouth every morning.  6  . Calcium Carbonate-Vitamin D3 (CALCIUM 600-D) 600-400 MG-UNIT TABS Take 1 tablet by mouth 2 (two) times daily.    . carvedilol (COREG) 6.25 MG tablet Take 6.25 mg by mouth 2 (two) times daily.  6  . cloNIDine (CATAPRES) 0.1 MG tablet Take 0.1 mg by mouth 2 (two) times daily.     . clopidogrel (PLAVIX) 75 MG tablet Take 1 tablet (75 mg total) by mouth daily. 30 tablet 3  . gabapentin (NEURONTIN) 300 MG capsule Take 300 mg by mouth 3 (three) times daily.  2  . hydrocortisone 2.5 % ointment Apply 1 application topically 2 (two) times daily. Mix with lidocaine  2  . lidocaine (LMX) 4 % cream Apply 1 application topically 2 (two) times daily. Mix with hydrocortisone    . lisinopril (PRINIVIL,ZESTRIL) 40 MG tablet Take 40 mg by mouth daily.     Marland Kitchen. triamterene-hydrochlorothiazide (MAXZIDE-25) 37.5-25 MG tablet Take 1 tablet by mouth daily.     Marland Kitchen. venlafaxine (EFFEXOR) 75 MG tablet Take 225 mg by mouth daily.  No current facility-administered medications for this visit.     Review of Systems  Constitutional:  Constitutional negative. HENT: HENT negative.  Eyes: Eyes negative.  Respiratory: Respiratory negative.  Cardiovascular: Cardiovascular negative.  GI: Gastrointestinal negative.  Musculoskeletal: Musculoskeletal negative.  Skin: Skin negative.  Neurological: Neurological negative. Hematologic: Hematologic/lymphatic negative.  Psychiatric: Psychiatric negative.        Objective:  Objective   Vitals:   01/17/19 0929  BP: (!) 148/96  Pulse: 77  Resp: 20  Temp: (!) 97.5 F (36.4 C)  SpO2: 94%  Weight: 204 lb (92.5 kg)  Height: 5\' 5"  (1.651 m)   Body mass index is 33.95 kg/m.  Physical Exam Constitutional:      Appearance:  Normal appearance.  HENT:     Head: Normocephalic.  Eyes:     Pupils: Pupils are equal, round, and reactive to light.  Neck:     Musculoskeletal: Normal range of motion and neck supple.  Cardiovascular:     Rate and Rhythm: Normal rate and regular rhythm.     Pulses:          Radial pulses are 2+ on the right side and 2+ on the left side.       Popliteal pulses are 2+ on the right side and 2+ on the left side.       Dorsalis pedis pulses are 2+ on the right side and 0 on the left side.       Posterior tibial pulses are 2+ on the right side and 2+ on the left side.  Pulmonary:     Effort: Pulmonary effort is normal.  Abdominal:     General: Abdomen is flat.     Palpations: Abdomen is soft.  Skin:    General: Skin is warm and dry.  Neurological:     General: No focal deficit present.     Mental Status: She is alert.  Psychiatric:        Mood and Affect: Mood normal.        Behavior: Behavior normal.        Thought Content: Thought content normal.        Judgment: Judgment normal.     Data: I have independently interpreted her aortoiliac duplex which demonstrates possible 59 9% stenosis although all waveforms are triphasic I have independently interpreted her ABIs which are 0.88 and 0.89 right and left respectively.      Assessment/Plan:     58 year old female follows up after right common iliac artery stenting for dusky appearance to the right second toe after low-grade trauma.  She now has palpable pedal pulses on the right foot appears well perfused.  She continues her aspirin and Plavix and will follow-up in 6 months with repeat studies.  I have counseled her on smoking cessation she is down to 3 cigarettes daily which I have congratulated her on.     Maeola Harman MD [facility]

## 2019-03-16 DIAGNOSIS — I10 Essential (primary) hypertension: Secondary | ICD-10-CM

## 2019-04-21 ENCOUNTER — Other Ambulatory Visit: Payer: Self-pay | Admitting: Vascular Surgery

## 2019-10-01 ENCOUNTER — Other Ambulatory Visit: Payer: Self-pay

## 2019-10-01 DIAGNOSIS — I998 Other disorder of circulatory system: Secondary | ICD-10-CM

## 2019-10-01 DIAGNOSIS — I739 Peripheral vascular disease, unspecified: Secondary | ICD-10-CM

## 2019-10-01 DIAGNOSIS — I70229 Atherosclerosis of native arteries of extremities with rest pain, unspecified extremity: Secondary | ICD-10-CM

## 2019-10-02 ENCOUNTER — Telehealth (HOSPITAL_COMMUNITY): Payer: Self-pay

## 2019-10-02 NOTE — Telephone Encounter (Signed)

## 2019-10-03 ENCOUNTER — Ambulatory Visit (INDEPENDENT_AMBULATORY_CARE_PROVIDER_SITE_OTHER): Payer: Self-pay | Admitting: Vascular Surgery

## 2019-10-03 ENCOUNTER — Ambulatory Visit (INDEPENDENT_AMBULATORY_CARE_PROVIDER_SITE_OTHER)
Admission: RE | Admit: 2019-10-03 | Discharge: 2019-10-03 | Disposition: A | Discharge status: Home / Self Care | Payer: Self-pay | Source: Ambulatory Visit | Attending: Vascular Surgery | Admitting: Vascular Surgery

## 2019-10-03 ENCOUNTER — Other Ambulatory Visit: Payer: Self-pay

## 2019-10-03 ENCOUNTER — Ambulatory Visit (HOSPITAL_COMMUNITY)
Admission: RE | Admit: 2019-10-03 | Discharge: 2019-10-03 | Disposition: A | Discharge status: Home / Self Care | Payer: Self-pay | Source: Ambulatory Visit | Attending: Vascular Surgery | Admitting: Vascular Surgery

## 2019-10-03 ENCOUNTER — Encounter: Payer: Self-pay | Admitting: Vascular Surgery

## 2019-10-03 VITALS — BP 130/80 | HR 64 | Temp 97.6°F | Resp 20 | Ht 65.0 in | Wt 225.7 lb

## 2019-10-03 DIAGNOSIS — I739 Peripheral vascular disease, unspecified: Secondary | ICD-10-CM

## 2019-10-03 DIAGNOSIS — I87002 Postthrombotic syndrome without complications of left lower extremity: Secondary | ICD-10-CM

## 2019-10-03 DIAGNOSIS — I998 Other disorder of circulatory system: Secondary | ICD-10-CM | POA: Insufficient documentation

## 2019-10-03 DIAGNOSIS — I70229 Atherosclerosis of native arteries of extremities with rest pain, unspecified extremity: Secondary | ICD-10-CM

## 2019-10-03 NOTE — Progress Notes (Signed)
Patient ID: Rachel Chen, female   DOB: 1961-11-21, 58 y.o.   MRN: [id number]  Reason for Consult: Follow-up   Referred by Philemon KingdomProchnau, Caroline, MD  Subjective:     HPI:  Rachel Chen is a 58 y.o. female with history of right common iliac artery stenting.  That was done for third toe ulceration which subsequently healed.  At last visit she states she was still smoking 5 cigarettes although I listed 3.  She is now vaping.  She was recently diagnosed with left lower extremity DVT placed initially on Eliquis now on Coumadin.  She has had some issue with controlling her INR.  Her chief complaint regarding her lower extremities today is swelling in the left lower extremity.  She has not been wearing compression stockings.  She does elevate her legs.  She has unfortunately gained some weight with this leg pain and not being able to walk as much.  She denies any frank claudication.  Does not have any further wounds.  Past Medical History:  Diagnosis Date  . Anxiety   . Depression   . Hypertension    History reviewed. No pertinent family history. Past Surgical History:  Procedure Laterality Date  . ABDOMINAL HYSTERECTOMY    . BACK SURGERY    . LOWER EXTREMITY ANGIOGRAPHY N/A 08/26/2018   Procedure: LOWER EXTREMITY ANGIOGRAPHY;  Surgeon: Maeola Harmanain, Aryn Kops Christopher, MD;  [facility];  Service: Cardiovascular;  Laterality: N/A;  . PERIPHERAL VASCULAR INTERVENTION Right 08/26/2018   Procedure: PERIPHERAL VASCULAR INTERVENTION;  Surgeon: Maeola Harmanain, Dinora Hemm Christopher, MD;  [facility];  Service: Cardiovascular;  Laterality: Right;  Common iliac    Short Social History:  Social History   Tobacco Use  . Smoking status: Current Every Day Smoker    Packs/day: 1.00    Types: Cigarettes  . Smokeless tobacco: Never Used  Substance Use Topics  . Alcohol use: Yes    Comment: occassional    No Known Allergies  Current Outpatient Medications  Medication Sig  Dispense Refill  . ALPRAZolam (XANAX) 1 MG tablet Take 1 mg by mouth 3 (three) times daily.     Marland Kitchen. amitriptyline (ELAVIL) 25 MG tablet Take 25 mg by mouth at bedtime.    Marland Kitchen. amLODipine (NORVASC) 10 MG tablet Take 10 mg by mouth daily.     . ARIPiprazole (ABILIFY) 2 MG tablet Take 2 mg by mouth daily.    Marland Kitchen. buPROPion (WELLBUTRIN XL) 300 MG 24 hr tablet Take 300 mg by mouth every morning.  6  . Calcium Carbonate-Vit D-Min (CALCIUM 600+D PLUS MINERALS) 600-400 MG-UNIT TABS Take by mouth.    . Calcium Carbonate-Vitamin D3 (CALCIUM 600-D) 600-400 MG-UNIT TABS Take 1 tablet by mouth 2 (two) times daily.    . carvedilol (COREG) 6.25 MG tablet Take 6.25 mg by mouth 2 (two) times daily.  6  . cloNIDine (CATAPRES) 0.1 MG tablet Take 0.1 mg by mouth 2 (two) times daily.     . fluticasone (FLONASE) 50 MCG/ACT nasal spray Place into the nose.    . furosemide (LASIX) 20 MG tablet Take 20 mg by mouth daily.    Marland Kitchen. gabapentin (NEURONTIN) 300 MG capsule Take 300 mg by mouth 3 (three) times daily.  2  . lidocaine (LMX) 4 % cream Apply 1 application topically 2 (two) times daily. Mix with hydrocortisone    . lisinopril (PRINIVIL,ZESTRIL) 40 MG tablet Take 40 mg by mouth daily.     . rosuvastatin (CRESTOR) 5  MG tablet Take by mouth.    . triamterene-hydrochlorothiazide (MAXZIDE-25) 37.5-25 MG tablet Take 1 tablet by mouth daily.     Marland Kitchen venlafaxine (EFFEXOR) 75 MG tablet Take 225 mg by mouth daily.     Marland Kitchen warfarin (COUMADIN) 7.5 MG tablet TAKE ONE TABLET BY MOUTH EVERY DAY     No current facility-administered medications for this visit.     Review of Systems  Constitutional: Positive for unexpected weight change.  HENT: HENT negative.  Eyes: Eyes negative.  Respiratory: Respiratory negative.  Cardiovascular: Positive for leg swelling.  GI: Gastrointestinal negative.  Musculoskeletal: Positive for leg pain.  Skin: Skin negative.  Neurological: Neurological negative. Hematologic:       Recent DVT Psychiatric:  Psychiatric negative.        Objective:  Objective   Vitals:   10/03/19 0903  BP: 130/80  Pulse: 64  Resp: 20  Temp: 97.6 F (36.4 C)  SpO2: 95%  Weight: 225 lb 11.2 oz (102.4 kg)  Height: 5\' 5"  (1.651 m)   Body mass index is 37.56 kg/m.  Physical Exam HENT:     Head: Normocephalic.     Nose: Nose normal.     Mouth/Throat:     Mouth: Mucous membranes are moist.  Eyes:     Pupils: Pupils are equal, round, and reactive to light.  Neck:     Vascular: No carotid bruit.  Cardiovascular:     Rate and Rhythm: Normal rate.     Pulses:          Radial pulses are 2+ on the right side and 2+ on the left side.       Femoral pulses are 0 on the right side and 0 on the left side. Pulmonary:     Effort: Pulmonary effort is normal.     Breath sounds: Normal breath sounds.  Abdominal:     General: Abdomen is flat.     Palpations: Abdomen is soft. There is no mass.  Musculoskeletal:     Right lower leg: No edema.     Left lower leg: Edema present.  Skin:    General: Skin is warm and dry.     Capillary Refill: Capillary refill takes less than 2 seconds.  Neurological:     General: No focal deficit present.     Mental Status: She is alert.  Psychiatric:        Mood and Affect: Mood normal.        Behavior: Behavior normal.        Thought Content: Thought content normal.        Judgment: Judgment normal.     Data: I have independently interpreted her aortoiliac duplex which demonstrates monophasic flow in her bilateral common and external iliac arteries.  There are velocities suggesting 50 to 99% stenosis bilaterally.  Her ABIs today are 0.5 right and 0.73 left previously were 0.88 and 0.89.  Toe pressure today 73 on the right and 61 on the left.     Assessment/Plan:     58 year old female presents for evaluation of right common iliac artery stent.  This was done to heal the wound.  Patient continues to vape.  She is now on Coumadin for extensive left lower extremity DVT  and her chief complaint today is swelling in the left lower extremity and difficulty controlling her INR.  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  This time she has no wounds and symptoms appear to be more related to swelling.  She does have preserved toe pressures bilaterally and I have recommended left lower extremity compression stockings at least moderate grade.  Likely she will need intervention of her bilateral common iliac arteries in the future but given her recent events I would not proceed with that at this time.  I have recommended smoking cessation and a walking program.  She will get compression stockings.  I will see her in 3 to 4 months for further evaluation.     Maeola Harman MD [facility]

## 2020-01-01 ENCOUNTER — Other Ambulatory Visit: Payer: Self-pay

## 2020-01-01 DIAGNOSIS — I739 Peripheral vascular disease, unspecified: Secondary | ICD-10-CM

## 2020-01-02 ENCOUNTER — Ambulatory Visit (INDEPENDENT_AMBULATORY_CARE_PROVIDER_SITE_OTHER): Payer: Self-pay | Admitting: Vascular Surgery

## 2020-01-02 ENCOUNTER — Ambulatory Visit (HOSPITAL_COMMUNITY)
Admission: RE | Admit: 2020-01-02 | Discharge: 2020-01-02 | Disposition: A | Discharge status: Home / Self Care | Payer: PRIVATE HEALTH INSURANCE | Source: Ambulatory Visit | Attending: Surgery | Admitting: Surgery

## 2020-01-02 ENCOUNTER — Ambulatory Visit (INDEPENDENT_AMBULATORY_CARE_PROVIDER_SITE_OTHER)
Admission: RE | Admit: 2020-01-02 | Discharge: 2020-01-02 | Disposition: A | Discharge status: Home / Self Care | Payer: PRIVATE HEALTH INSURANCE | Source: Ambulatory Visit | Attending: Surgery | Admitting: Surgery

## 2020-01-02 ENCOUNTER — Encounter: Payer: Self-pay | Admitting: Vascular Surgery

## 2020-01-02 ENCOUNTER — Other Ambulatory Visit: Payer: Self-pay

## 2020-01-02 VITALS — BP 139/75 | HR 64 | Temp 97.8°F | Resp 20 | Ht 65.0 in | Wt 229.5 lb

## 2020-01-02 DIAGNOSIS — I739 Peripheral vascular disease, unspecified: Secondary | ICD-10-CM

## 2020-01-02 NOTE — Progress Notes (Signed)
Patient ID: Rachel Chen, female   DOB: Dec 15, 1961, 59 y.o.   MRN: [id number]  Reason for Consult: No chief complaint on file.   Referred by Ernestene Kiel, MD  Subjective:     HPI:  Rachel Chen is a 59 y.o. female with history of right common iliac artery stenting which was done for toe ulceration that then healed.  At last visit patient was vaping and was no longer smoking. Has since quit smoking and vaping altogether. She did have a DVT was placed on Eliquis and then transition to Coumadin which she is still taking and plan is for indefinite.  She is having swelling in her left lower extremity particularly when she walks.  Last visit her aortoiliac duplex demonstrated 50-99% stenosis bilaterally with monophasic flow.  She has noted walking 3500 steps daily.  States that her feet feel good she has no tissue loss or ulceration.  Risk factors for vascular disease include hypertension and previous smoking status.  She is on Coumadin does take Crestor daily.  Past Medical History:  Diagnosis Date  . Anxiety   . Depression   . Hypertension    No family history on file. Past Surgical History:  Procedure Laterality Date  . ABDOMINAL HYSTERECTOMY    . BACK SURGERY    . LOWER EXTREMITY ANGIOGRAPHY N/A 08/26/2018   Procedure: LOWER EXTREMITY ANGIOGRAPHY;  Surgeon: Waynetta Sandy, MD;  Location: [facility];  Service: Cardiovascular;  Laterality: N/A;  . PERIPHERAL VASCULAR INTERVENTION Right 08/26/2018   Procedure: PERIPHERAL VASCULAR INTERVENTION;  Surgeon: Waynetta Sandy, MD;  Location: [facility];  Service: Cardiovascular;  Laterality: Right;  Common iliac    Short Social History:  Social History   Tobacco Use  . Smoking status: Current Every Day Smoker    Packs/day: 1.00    Types: Cigarettes  . Smokeless tobacco: Never Used  Substance Use Topics  . Alcohol use: Yes    Comment: occassional    No Known Allergies  Current Outpatient  Medications  Medication Sig Dispense Refill  . ALPRAZolam (XANAX) 1 MG tablet Take 1 mg by mouth 3 (three) times daily.     Marland Kitchen amitriptyline (ELAVIL) 25 MG tablet Take 25 mg by mouth at bedtime.    Marland Kitchen amLODipine (NORVASC) 10 MG tablet Take 10 mg by mouth daily.     . ARIPiprazole (ABILIFY) 2 MG tablet Take 2 mg by mouth daily.    Marland Kitchen buPROPion (WELLBUTRIN XL) 300 MG 24 hr tablet Take 300 mg by mouth every morning.  6  . Calcium Carbonate-Vit D-Min (CALCIUM 600+D PLUS MINERALS) 600-400 MG-UNIT TABS Take by mouth.    . Calcium Carbonate-Vitamin D3 (CALCIUM 600-D) 600-400 MG-UNIT TABS Take 1 tablet by mouth 2 (two) times daily.    . carvedilol (COREG) 6.25 MG tablet Take 6.25 mg by mouth 2 (two) times daily.  6  . cloNIDine (CATAPRES) 0.1 MG tablet Take 0.1 mg by mouth 2 (two) times daily.     . fluticasone (FLONASE) 50 MCG/ACT nasal spray Place into the nose.    . furosemide (LASIX) 20 MG tablet Take 20 mg by mouth daily.    Marland Kitchen gabapentin (NEURONTIN) 300 MG capsule Take 300 mg by mouth 3 (three) times daily.  2  . lidocaine (LMX) 4 % cream Apply 1 application topically 2 (two) times daily. Mix with hydrocortisone    . lisinopril (PRINIVIL,ZESTRIL) 40 MG tablet Take 40 mg by mouth daily.     Marland Kitchen  rosuvastatin (CRESTOR) 5 MG tablet Take by mouth.    . triamterene-hydrochlorothiazide (MAXZIDE-25) 37.5-25 MG tablet Take 1 tablet by mouth daily.     Marland Kitchen venlafaxine (EFFEXOR) 75 MG tablet Take 225 mg by mouth daily.     Marland Kitchen warfarin (COUMADIN) 7.5 MG tablet TAKE ONE TABLET BY MOUTH EVERY DAY     No current facility-administered medications for this visit.    Review of Systems  Constitutional:  Constitutional negative. HENT: HENT negative.  Eyes: Eyes negative.  Respiratory: Respiratory negative.  Cardiovascular: Positive for leg swelling.  GI: Gastrointestinal negative.  Musculoskeletal: Musculoskeletal negative.  Skin: Skin negative.  Neurological: Neurological negative. Hematologic:  Hematologic/lymphatic negative.  Psychiatric: Psychiatric negative.        Objective:  Objective  Vitals:   01/02/20 0946  BP: 139/75  Pulse: 64  Resp: 20  Temp: 97.8 F (36.6 C)  SpO2: 95%    Physical Exam Constitutional:      Appearance: Normal appearance.  HENT:     Head: Normocephalic.     Nose: Nose normal.     Mouth/Throat:     Mouth: Mucous membranes are moist.  Eyes:     Pupils: Pupils are equal, round, and reactive to light.  Cardiovascular:     Rate and Rhythm: Normal rate.  Pulmonary:     Effort: Pulmonary effort is normal.  Abdominal:     General: Abdomen is flat.     Palpations: Abdomen is soft.  Musculoskeletal:        General: No swelling. Normal range of motion.     Cervical back: Normal range of motion and neck supple.  Skin:    General: Skin is warm and dry.     Capillary Refill: Capillary refill takes less than 2 seconds.  Neurological:     General: No focal deficit present.     Mental Status: She is alert.  Psychiatric:        Mood and Affect: Mood normal.        Behavior: Behavior normal.        Thought Content: Thought content normal.        Judgment: Judgment normal.     Data: I have independently interpreted her ABIs to be 0.55 right with toe pressure 79 and 0.73 left with toe pressure 61.  I have independently interpreted her aortoiliac duplex.  On the right side peak velocity is 431 cm/s.  On the left side peak systolic velocity is 145 cm/s this is less than previous study.  There is no identifiable stenosis on the left common iliac artery today.  The stent of the right does have greater than 50% stenosis.      Assessment/Plan:     59 year old female with previous history of right common iliac artery stent for tissue loss which is now healed.  She has quit smoking.  She is now walking 3500 steps.  She will follow-up in 1 year.  She states at that time she walking 10,000 steps will still be non-smoking.  Plan for Coumadin is  indefinite from her primary caregiver for multiple histories of DVT.  I congratulated her on smoking cessation as well as her walking program.     Maeola Harman MD [facility]

## 2020-01-06 ENCOUNTER — Other Ambulatory Visit: Payer: Self-pay | Admitting: *Deleted

## 2020-01-06 DIAGNOSIS — I739 Peripheral vascular disease, unspecified: Secondary | ICD-10-CM

## 2020-10-21 DIAGNOSIS — G8929 Other chronic pain: Secondary | ICD-10-CM | POA: Insufficient documentation

## 2020-11-23 DIAGNOSIS — M5136 Other intervertebral disc degeneration, lumbar region: Secondary | ICD-10-CM | POA: Insufficient documentation

## 2020-11-23 DIAGNOSIS — M47816 Spondylosis without myelopathy or radiculopathy, lumbar region: Secondary | ICD-10-CM | POA: Insufficient documentation

## 2020-11-23 DIAGNOSIS — Z9889 Other specified postprocedural states: Secondary | ICD-10-CM | POA: Insufficient documentation

## 2020-12-23 DIAGNOSIS — Z7901 Long term (current) use of anticoagulants: Secondary | ICD-10-CM | POA: Insufficient documentation

## 2021-02-28 DIAGNOSIS — S92511A Displaced fracture of proximal phalanx of right lesser toe(s), initial encounter for closed fracture: Secondary | ICD-10-CM | POA: Insufficient documentation

## 2021-05-25 DIAGNOSIS — M170 Bilateral primary osteoarthritis of knee: Secondary | ICD-10-CM | POA: Insufficient documentation

## 2021-07-07 DIAGNOSIS — M461 Sacroiliitis, not elsewhere classified: Secondary | ICD-10-CM | POA: Insufficient documentation

## 2021-07-07 DIAGNOSIS — M160 Bilateral primary osteoarthritis of hip: Secondary | ICD-10-CM | POA: Insufficient documentation

## 2021-08-08 ENCOUNTER — Other Ambulatory Visit: Payer: Self-pay | Admitting: Orthopedic Surgery

## 2021-08-10 DIAGNOSIS — I7409 Other arterial embolism and thrombosis of abdominal aorta: Secondary | ICD-10-CM | POA: Insufficient documentation

## 2021-08-19 DIAGNOSIS — R600 Localized edema: Secondary | ICD-10-CM | POA: Insufficient documentation

## 2021-08-19 DIAGNOSIS — I82532 Chronic embolism and thrombosis of left popliteal vein: Secondary | ICD-10-CM | POA: Insufficient documentation

## 2021-08-19 DIAGNOSIS — I10 Essential (primary) hypertension: Secondary | ICD-10-CM | POA: Insufficient documentation

## 2021-08-19 DIAGNOSIS — Z720 Tobacco use: Secondary | ICD-10-CM | POA: Insufficient documentation

## 2021-09-01 HISTORY — PX: AORTIC ENDARTERECETOMY: SHX5724

## 2021-10-10 NOTE — Progress Notes (Addendum)
Anesthesia Review:  PCP: DR Philemon Kingdom - Requested clearance from [facility] at [facility].  She is to fax.  Clearance dated 07/14/21 on chart LOV 07/04/21 on chart.  Cardiologist :? Vascular - Dr Cherly Hensen LOV 10/04/21 with clearance in note.   Chest x-ray : EKG : 08/19/21- have requested by fax from [facility] Med Records  on chart.  Echo : Stress test: Cardiac Cath :  Activity level: can do a flight of stairs without difficulty  Sleep Study/ CPAP : none  Fasting Blood Sugar :      / Checks Blood Sugar -- times a day:   Blood Thinner/ Instructions /Last Dose: ASA / Instructions/ Last Dose :    Coumadin- stop 5 days prior per pt  PT had Aorta Endartarectomy on 09/01/21 by Dr Cherly Hensen.   Also hx of DVT  Covid test on 10/20/21.  CBC done 10/12/21 routed to DR Lucey.

## 2021-10-10 NOTE — Progress Notes (Signed)
DUE TO COVID-19 ONLY ONE VISITOR IS ALLOWED TO COME WITH YOU AND STAY IN THE WAITING ROOM ONLY DURING PRE OP AND PROCEDURE DAY OF SURGERY.  2 VISITOR  MAY VISIT WITH YOU AFTER SURGERY IN YOUR PRIVATE ROOM DURING VISITING HOURS ONLY!  YOU NEED TO HAVE A COVID 19 TEST ON__11/02/2021 @_  @_from  8am-3pm _____, THIS TEST MUST BE DONE BEFORE SURGERY,  Covid test is done at [address] [place], [address].  This is a drive thru.  No appt required. Please see map.                 Your procedure is scheduled on:    10/24/2021   Report to [facility] Main  Entrance   Report to admitting at    [phone]     Call this number if you have problems the morning of surgery [phone]    REMEMBER: NO  SOLID FOOD CANDY OR GUM AFTER MIDNIGHT. CLEAR LIQUIDS UNTIL 0415am         . NOTHING BY MOUTH EXCEPT CLEAR LIQUIDS UNTIL     0415am  . PLEASE FINISH ENSURE DRINK PER SURGEON ORDER  WHICH NEEDS TO BE COMPLETED AT  0415am     .      CLEAR LIQUID DIET   Foods Allowed                                                                    Coffee and tea, regular and decaf                            Fruit ices (not with fruit pulp)                                      Iced Popsicles                                    Carbonated beverages, regular and diet                                    Cranberry, grape and apple juices Sports drinks like Gatorade Lightly seasoned clear broth or consume(fat free) Sugar, honey syrup ___________________________________________________________________      BRUSH YOUR TEETH MORNING OF SURGERY AND RINSE YOUR MOUTH OUT, NO CHEWING GUM CANDY OR MINTS.     Take these medicines the morning of surgery with A SIP OF WATER:  amlodipine, abilify, wellbutrin, coreg, clonidine, gbapentin, effexor   DO NOT TAKE ANY DIABETIC MEDICATIONS DAY OF YOUR SURGERY                               You may not have any metal on your body including hair pins and               piercings  Do not wear jewelry, make-up, lotions, powders or perfumes, deodorant  Do not wear nail polish on your fingernails.  Do not shave  48 hours prior to surgery.              Men may shave face and neck.   Do not bring valuables to the hospital. [place].  Contacts, dentures or bridgework may not be worn into surgery.  Leave suitcase in the car. After surgery it may be brought to your room.     Patients discharged the day of surgery will not be allowed to drive home. IF YOU ARE HAVING SURGERY AND GOING HOME THE SAME DAY, YOU MUST HAVE AN ADULT TO DRIVE YOU HOME AND BE WITH YOU FOR 24 HOURS. YOU MAY GO HOME BY TAXI OR UBER OR ORTHERWISE, BUT AN ADULT MUST ACCOMPANY YOU HOME AND STAY WITH YOU FOR 24 HOURS.  Name and phone number of your driver:  Special Instructions: N/A              Please read over the following fact sheets you were given: _____________________________________________________________________  [facility] - Preparing for Surgery Before surgery, you can play an important role.  Because skin is not sterile, your skin needs to be as free of germs as possible.  You can reduce the number of germs on your skin by washing with CHG (chlorahexidine gluconate) soap before surgery.  CHG is an antiseptic cleaner which kills germs and bonds with the skin to continue killing germs even after washing. Please DO NOT use if you have an allergy to CHG or antibacterial soaps.  If your skin becomes reddened/irritated stop using the CHG and inform your nurse when you arrive at Short Stay. Do not shave (including legs and underarms) for at least 48 hours prior to the first CHG shower.  You may shave your face/neck. Please follow these instructions carefully:  1.  Shower with CHG Soap the night before surgery and the  morning of Surgery.  2.  If you choose to wash your hair, wash your hair first as usual with your  normal   shampoo.  3.  After you shampoo, rinse your hair and body thoroughly to remove the  shampoo.                           4.  Use CHG as you would any other liquid soap.  You can apply chg directly  to the skin and wash                       Gently with a scrungie or clean washcloth.  5.  Apply the CHG Soap to your body ONLY FROM THE NECK DOWN.   Do not use on face/ open                           Wound or open sores. Avoid contact with eyes, ears mouth and genitals (private parts).                       Wash face,  Genitals (private parts) with your normal soap.             6.  Wash thoroughly, paying special attention to the area where your surgery  will be performed.  7.  Thoroughly rinse your body with  warm water from the neck down.  8.  DO NOT shower/wash with your normal soap after using and rinsing off  the CHG Soap.                9.  Pat yourself dry with a clean towel.            10.  Wear clean pajamas.            11.  Place clean sheets on your bed the night of your first shower and do not  sleep with pets. Day of Surgery : Do not apply any lotions/deodorants the morning of surgery.  Please wear clean clothes to the hospital/surgery center.  FAILURE TO FOLLOW THESE INSTRUCTIONS MAY RESULT IN THE CANCELLATION OF YOUR SURGERY PATIENT SIGNATURE_________________________________  NURSE SIGNATURE__________________________________  ________________________________________________________________________

## 2021-10-12 ENCOUNTER — Other Ambulatory Visit: Payer: Self-pay

## 2021-10-12 ENCOUNTER — Encounter (HOSPITAL_COMMUNITY)
Admission: RE | Admit: 2021-10-12 | Discharge: 2021-10-12 | Disposition: A | Discharge status: Home / Self Care | Payer: PRIVATE HEALTH INSURANCE | Source: Ambulatory Visit | Attending: Orthopedic Surgery | Admitting: Orthopedic Surgery

## 2021-10-12 ENCOUNTER — Encounter (HOSPITAL_COMMUNITY): Payer: Self-pay

## 2021-10-12 VITALS — BP 126/76 | HR 61 | Temp 98.2°F | Resp 16 | Ht 65.0 in | Wt 216.0 lb

## 2021-10-12 DIAGNOSIS — Z79899 Other long term (current) drug therapy: Secondary | ICD-10-CM | POA: Diagnosis not present

## 2021-10-12 DIAGNOSIS — I10 Essential (primary) hypertension: Secondary | ICD-10-CM | POA: Diagnosis not present

## 2021-10-12 DIAGNOSIS — Z01818 Encounter for other preprocedural examination: Secondary | ICD-10-CM

## 2021-10-12 DIAGNOSIS — Z87891 Personal history of nicotine dependence: Secondary | ICD-10-CM | POA: Insufficient documentation

## 2021-10-12 DIAGNOSIS — I739 Peripheral vascular disease, unspecified: Secondary | ICD-10-CM

## 2021-10-12 DIAGNOSIS — Z86718 Personal history of other venous thrombosis and embolism: Secondary | ICD-10-CM | POA: Insufficient documentation

## 2021-10-12 DIAGNOSIS — Z01812 Encounter for preprocedural laboratory examination: Secondary | ICD-10-CM | POA: Insufficient documentation

## 2021-10-12 DIAGNOSIS — Z7901 Long term (current) use of anticoagulants: Secondary | ICD-10-CM | POA: Insufficient documentation

## 2021-10-12 DIAGNOSIS — M1712 Unilateral primary osteoarthritis, left knee: Secondary | ICD-10-CM | POA: Diagnosis not present

## 2021-10-12 HISTORY — DX: Acute embolism and thrombosis of unspecified deep veins of unspecified lower extremity: I82.409

## 2021-10-12 HISTORY — DX: Peripheral vascular disease, unspecified: I73.9

## 2021-10-12 HISTORY — DX: Other specified postprocedural states: R11.2

## 2021-10-12 HISTORY — DX: Cardiac murmur, unspecified: R01.1

## 2021-10-12 HISTORY — DX: Unspecified osteoarthritis, unspecified site: M19.90

## 2021-10-12 HISTORY — DX: Other specified postprocedural states: Z98.890

## 2021-10-12 LAB — CBC WITH DIFFERENTIAL/PLATELET
Abs Immature Granulocytes: 0.03 10*3/uL (ref 0.00–0.07)
Basophils Absolute: 0 10*3/uL (ref 0.0–0.1)
Basophils Relative: 1 %
Eosinophils Absolute: 0.4 10*3/uL (ref 0.0–0.5)
Eosinophils Relative: 5 %
HCT: 33.6 % — ABNORMAL LOW (ref 36.0–46.0)
Hemoglobin: 10.3 g/dL — ABNORMAL LOW (ref 12.0–15.0)
Immature Granulocytes: 0 %
Lymphocytes Relative: 26 %
Lymphs Abs: 2 10*3/uL (ref 0.7–4.0)
MCH: 27 pg (ref 26.0–34.0)
MCHC: 30.7 g/dL (ref 30.0–36.0)
MCV: 88 fL (ref 80.0–100.0)
Monocytes Absolute: 0.6 10*3/uL (ref 0.1–1.0)
Monocytes Relative: 7 %
Neutro Abs: 4.6 10*3/uL (ref 1.7–7.7)
Neutrophils Relative %: 61 %
Platelets: 310 10*3/uL (ref 150–400)
RBC: 3.82 MIL/uL — ABNORMAL LOW (ref 3.87–5.11)
RDW: 16.2 % — ABNORMAL HIGH (ref 11.5–15.5)
WBC: 7.5 10*3/uL (ref 4.0–10.5)
nRBC: 0 % (ref 0.0–0.2)

## 2021-10-12 LAB — COMPREHENSIVE METABOLIC PANEL
ALT: 17 U/L (ref 0–44)
AST: 18 U/L (ref 15–41)
Albumin: 3.5 g/dL (ref 3.5–5.0)
Alkaline Phosphatase: 92 U/L (ref 38–126)
Anion gap: 6 (ref 5–15)
BUN: 15 mg/dL (ref 6–20)
CO2: 29 mmol/L (ref 22–32)
Calcium: 8.8 mg/dL — ABNORMAL LOW (ref 8.9–10.3)
Chloride: 101 mmol/L (ref 98–111)
Creatinine, Ser: 0.71 mg/dL (ref 0.44–1.00)
GFR, Estimated: >60 mL/min (ref >60)
Glucose, Bld: 101 mg/dL — ABNORMAL HIGH (ref 70–99)
Potassium: 4.1 mmol/L (ref 3.5–5.1)
Sodium: 136 mmol/L (ref 135–145)
Total Bilirubin: 0.3 mg/dL (ref 0.3–1.2)
Total Protein: 6.9 g/dL (ref 6.5–8.1)

## 2021-10-12 LAB — SURGICAL PCR SCREEN
MRSA, PCR: NEGATIVE
Staphylococcus aureus: NEGATIVE

## 2021-10-13 NOTE — Progress Notes (Signed)
Anesthesia Chart Review   Case: [id number] Date/Time: 10/24/21 0700   Procedure: TOTAL KNEE ARTHROPLASTY (Left: Knee)   Anesthesia type: Spinal   Pre-op diagnosis: Osteoarthritis of left knee M17.12   Location: [facility] ROOM 07 / [facility]   Surgeons: Dannielle Huh, MD       DISCUSSION:60 y.o. 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 Dannielle Huh.   Pt last seen by vascular surgeon 10/04/2021. Per OV note, pt is s/p aorto-iliac endarterectomy with patch angioplasty. Mild line incision with a healing wound with packing in place, healthy grandulating tissue.  Per Dr. Verl Bangs, "Can continue dressing to her wound with no contraindication for knee replacement. Will likely close without issue in the next month."  Clearance from PCP on chart which states pt is optimzed for surgery from a medical standpoint.   Pt reports she was advised to hold Coumadin for 5 days prior to procedure. PT/INR DOS.   Anticipate pt can proceed with planned procedure barring acute status change.   VS: BP 126/76   Pulse 61   Temp 36.8 C (Oral)   Resp 16   Ht 5\' 5"  (1.651 m)   Wt 98 kg   SpO2 100%   BMI 35.94 kg/m   PROVIDERS: , MD is PCP   Philemon Kingdom, MD is Vascular Surgeon LABS: Labs reviewed: Acceptable for surgery. (all labs ordered are listed, but only abnormal results are displayed)  Labs Reviewed  CBC WITH DIFFERENTIAL/PLATELET - Abnormal; Notable for the following components:      Result Value   RBC 3.82 (*)    Hemoglobin 10.3 (*)    HCT 33.6 (*)    RDW 16.2 (*)    All other components within normal limits  COMPREHENSIVE METABOLIC PANEL - Abnormal; Notable for the following components:   Glucose, Bld 101 (*)    Calcium 8.8 (*)    All other components within normal limits  SURGICAL PCR SCREEN     IMAGES:   EKG: On chart   CV:  Past Medical History:  Diagnosis  Date   Anxiety    Arthritis    Depression    DVT (deep venous thrombosis) (HCC)    H/O endarterectomy    aorta - 09/01/21   Heart murmur    Hypertension    Peripheral vascular disease (HCC)    aorta iliac disease   PONV (postoperative nausea and vomiting)     Past Surgical History:  Procedure Laterality Date   " clean out of abdominal aorta "      ABDOMINAL HYSTERECTOMY     BACK SURGERY     LOWER EXTREMITY ANGIOGRAPHY N/A 08/26/2018   Procedure: LOWER EXTREMITY ANGIOGRAPHY;  Surgeon: 10/26/2018, MD;  [facility] INVASIVE [facility] LAB;  Service: Cardiovascular;  Laterality: N/A;   PERIPHERAL VASCULAR INTERVENTION Right 08/26/2018   Procedure: PERIPHERAL VASCULAR INTERVENTION;  Surgeon: 10/26/2018, MD;  Location: [facility] INVASIVE CV LAB;  Service: Cardiovascular;  Laterality: Right;  Common iliac   right knee arthroscopy      torn hamstrin surgery on right leg       MEDICATIONS:  ALPRAZolam (XANAX) 1 MG tablet   amitriptyline (ELAVIL) 25 MG tablet   amLODipine (NORVASC) 10 MG tablet   ARIPiprazole (ABILIFY) 2 MG tablet   carvedilol (COREG) 6.25 MG tablet   cloNIDine (CATAPRES) 0.1 MG tablet   furosemide (LASIX) 40 MG tablet  gabapentin (NEURONTIN) 300 MG capsule   lisinopril (PRINIVIL,ZESTRIL) 40 MG tablet   Multiple Minerals-Vitamins (CAL MAG ZINC +D3) TABS   Multiple Vitamins-Minerals (PRESERVISION AREDS 2+MULTI VIT PO)   Potassium 99 MG TABS   rosuvastatin (CRESTOR) 5 MG tablet   triamterene-hydrochlorothiazide (MAXZIDE-25) 37.5-25 MG tablet   venlafaxine (EFFEXOR) 75 MG tablet   warfarin (COUMADIN) 10 MG tablet   warfarin (COUMADIN) 7.5 MG tablet   No current facility-administered medications for this encounter.    Jodell Cipro Ward, PA-C [facility]Surgical Testing [phone]

## 2021-10-13 NOTE — Anesthesia Preprocedure Evaluation (Addendum)
Anesthesia Evaluation  Patient identified by MRN, date of birth, ID band Patient awake    Reviewed: Allergy & Precautions, NPO status , Patient's Chart, lab work & pertinent test results  History of Anesthesia Complications (+) PONV and history of anesthetic complications  Airway Mallampati: II  TM Distance: >3 FB Neck ROM: Full    Dental  (+) Missing,    Pulmonary former smoker,    Pulmonary exam normal        Cardiovascular hypertension, Pt. on medications + Peripheral Vascular Disease (on Coumadin)  Normal cardiovascular exam     Neuro/Psych Anxiety Depression Lumbar spine surgery (no prior records)    GI/Hepatic negative GI ROS, Neg liver ROS,   Endo/Other  negative endocrine ROS  Renal/GU negative Renal ROS  negative genitourinary   Musculoskeletal  (+) Arthritis ,   Abdominal   Peds  Hematology  (+) anemia , Hgb 10.3   Anesthesia Other Findings Day of surgery medications reviewed with patient.  Reproductive/Obstetrics negative OB ROS                          Anesthesia Physical Anesthesia Plan  ASA: 2  Anesthesia Plan: Spinal   Post-op Pain Management:  Regional for Post-op pain   Induction:   PONV Risk Score and Plan: 4 or greater and Treatment may vary due to age or medical condition, Midazolam, Ondansetron, Dexamethasone and Propofol infusion  Airway Management Planned: Natural Airway and Simple Face Mask  Additional Equipment: None  Intra-op Plan:   Post-operative Plan:   Informed Consent: I have reviewed the patients History and Physical, chart, labs and discussed the procedure including the risks, benefits and alternatives for the proposed anesthesia with the patient or authorized representative who has indicated his/her understanding and acceptance.     Dental advisory given  Plan Discussed with: CRNA  Anesthesia Plan Comments: (See PAT note 10/12/2021,  Jodell Cipro Ward, PA-C)      Anesthesia Quick Evaluation

## 2021-10-20 ENCOUNTER — Other Ambulatory Visit: Payer: Self-pay | Admitting: Orthopedic Surgery

## 2021-10-20 LAB — SARS CORONAVIRUS 2 (TAT 6-24 HRS): SARS Coronavirus 2: NEGATIVE

## 2021-10-24 ENCOUNTER — Encounter (HOSPITAL_COMMUNITY)
Admission: RE | Disposition: A | Discharge status: Home / Self Care | Payer: Self-pay | Source: Ambulatory Visit | Attending: Orthopedic Surgery

## 2021-10-24 ENCOUNTER — Ambulatory Visit (HOSPITAL_COMMUNITY): Payer: PRIVATE HEALTH INSURANCE | Admitting: Physician Assistant

## 2021-10-24 ENCOUNTER — Other Ambulatory Visit: Payer: Self-pay

## 2021-10-24 ENCOUNTER — Encounter (HOSPITAL_COMMUNITY): Payer: Self-pay | Admitting: Orthopedic Surgery

## 2021-10-24 ENCOUNTER — Observation Stay (HOSPITAL_COMMUNITY)
Admission: RE | Admit: 2021-10-24 | Discharge: 2021-10-25 | Disposition: A | Discharge status: Home / Self Care | Payer: PRIVATE HEALTH INSURANCE | Source: Ambulatory Visit | Attending: Orthopedic Surgery | Admitting: Orthopedic Surgery

## 2021-10-24 ENCOUNTER — Ambulatory Visit (HOSPITAL_COMMUNITY): Payer: PRIVATE HEALTH INSURANCE | Admitting: Anesthesiology

## 2021-10-24 DIAGNOSIS — M1712 Unilateral primary osteoarthritis, left knee: Principal | ICD-10-CM | POA: Insufficient documentation

## 2021-10-24 DIAGNOSIS — Z8679 Personal history of other diseases of the circulatory system: Secondary | ICD-10-CM | POA: Diagnosis not present

## 2021-10-24 DIAGNOSIS — Z86718 Personal history of other venous thrombosis and embolism: Secondary | ICD-10-CM | POA: Insufficient documentation

## 2021-10-24 DIAGNOSIS — R011 Cardiac murmur, unspecified: Secondary | ICD-10-CM | POA: Diagnosis not present

## 2021-10-24 DIAGNOSIS — Z96659 Presence of unspecified artificial knee joint: Secondary | ICD-10-CM

## 2021-10-24 DIAGNOSIS — Z87891 Personal history of nicotine dependence: Secondary | ICD-10-CM | POA: Insufficient documentation

## 2021-10-24 DIAGNOSIS — Z79899 Other long term (current) drug therapy: Secondary | ICD-10-CM | POA: Diagnosis not present

## 2021-10-24 DIAGNOSIS — I739 Peripheral vascular disease, unspecified: Secondary | ICD-10-CM

## 2021-10-24 DIAGNOSIS — Z7901 Long term (current) use of anticoagulants: Secondary | ICD-10-CM | POA: Diagnosis not present

## 2021-10-24 DIAGNOSIS — I10 Essential (primary) hypertension: Secondary | ICD-10-CM | POA: Insufficient documentation

## 2021-10-24 HISTORY — PX: TOTAL KNEE ARTHROPLASTY: SHX125

## 2021-10-24 LAB — PROTIME-INR
INR: 1 (ref 0.8–1.2)
Prothrombin Time: 13.1 seconds (ref 11.4–15.2)

## 2021-10-24 SURGERY — ARTHROPLASTY, KNEE, TOTAL
Anesthesia: Spinal | Site: Knee | Laterality: Left

## 2021-10-24 MED ORDER — BUPIVACAINE LIPOSOME 1.3 % IJ SUSP: 20.0000 mL | Freq: Once | INTRAMUSCULAR | Status: DC

## 2021-10-24 MED ORDER — ONDANSETRON HCL 4 MG PO TABS: 4.0000 mg | ORAL_TABLET | Freq: Four times a day (QID) | ORAL | Status: DC | PRN

## 2021-10-24 MED ORDER — METOCLOPRAMIDE HCL 5 MG PO TABS: 5.0000 mg | ORAL_TABLET | Freq: Three times a day (TID) | ORAL | Status: DC | PRN

## 2021-10-24 MED ORDER — POVIDONE-IODINE 10 % EX SWAB
2.0000 "application " | Freq: Once | CUTANEOUS | Status: AC
  Administered 2021-10-24: 2 via TOPICAL

## 2021-10-24 MED ORDER — BISACODYL 5 MG PO TBEC: 5.0000 mg | DELAYED_RELEASE_TABLET | Freq: Every day | ORAL | Status: DC | PRN

## 2021-10-24 MED ORDER — ALUM & MAG HYDROXIDE-SIMETH 200-200-20 MG/5ML PO SUSP: 30.0000 mL | ORAL | Status: DC | PRN

## 2021-10-24 MED ORDER — FLEET ENEMA 7-19 GM/118ML RE ENEM: 1.0000 | ENEMA | Freq: Once | RECTAL | Status: DC | PRN

## 2021-10-24 MED ORDER — FENTANYL CITRATE (PF) 250 MCG/5ML IJ SOLN
INTRAMUSCULAR | Status: DC | PRN
  Administered 2021-10-24: 25 ug via INTRAVENOUS
  Administered 2021-10-24: 50 ug via INTRAVENOUS
  Administered 2021-10-24 (×3): 25 ug via INTRAVENOUS

## 2021-10-24 MED ORDER — CLONIDINE HCL (ANALGESIA) 100 MCG/ML EP SOLN
EPIDURAL | Status: DC | PRN
  Administered 2021-10-24: 100 ug

## 2021-10-24 MED ORDER — POTASSIUM 99 MG PO TABS: 99.0000 mg | ORAL_TABLET | Freq: Every day | ORAL | Status: DC

## 2021-10-24 MED ORDER — CLONIDINE HCL 0.1 MG PO TABS
0.1000 mg | ORAL_TABLET | Freq: Two times a day (BID) | ORAL | Status: DC
  Administered 2021-10-24 – 2021-10-25 (×2): 0.1 mg via ORAL
  Filled 2021-10-24 (×2): qty 1

## 2021-10-24 MED ORDER — DOCUSATE SODIUM 100 MG PO CAPS
100.0000 mg | ORAL_CAPSULE | Freq: Two times a day (BID) | ORAL | Status: DC
  Administered 2021-10-24 – 2021-10-25 (×2): 100 mg via ORAL
  Filled 2021-10-24 (×2): qty 1

## 2021-10-24 MED ORDER — MENTHOL 3 MG MT LOZG: 1.0000 | LOZENGE | OROMUCOSAL | Status: DC | PRN

## 2021-10-24 MED ORDER — GABAPENTIN 300 MG PO CAPS
300.0000 mg | ORAL_CAPSULE | Freq: Three times a day (TID) | ORAL | Status: DC
  Administered 2021-10-24 – 2021-10-25 (×3): 300 mg via ORAL
  Filled 2021-10-24 (×3): qty 1

## 2021-10-24 MED ORDER — BUPIVACAINE LIPOSOME 1.3 % IJ SUSP
INTRAMUSCULAR | Status: AC
  Filled 2021-10-24: qty 20

## 2021-10-24 MED ORDER — METOCLOPRAMIDE HCL 5 MG/ML IJ SOLN: 5.0000 mg | Freq: Three times a day (TID) | INTRAMUSCULAR | Status: DC | PRN

## 2021-10-24 MED ORDER — BUPIVACAINE IN DEXTROSE 0.75-8.25 % IT SOLN
INTRATHECAL | Status: DC | PRN
  Administered 2021-10-24: 1.6 mL via INTRATHECAL

## 2021-10-24 MED ORDER — LIDOCAINE 2% (20 MG/ML) 5 ML SYRINGE
INTRAMUSCULAR | Status: DC | PRN
  Administered 2021-10-24: 40 mg via INTRAVENOUS

## 2021-10-24 MED ORDER — ALPRAZOLAM 1 MG PO TABS: 1.0000 mg | ORAL_TABLET | Freq: Three times a day (TID) | ORAL | Status: DC | PRN

## 2021-10-24 MED ORDER — DEXAMETHASONE SODIUM PHOSPHATE 10 MG/ML IJ SOLN
10.0000 mg | Freq: Once | INTRAMUSCULAR | Status: AC
  Administered 2021-10-25: 10 mg via INTRAVENOUS
  Filled 2021-10-24: qty 1

## 2021-10-24 MED ORDER — CARVEDILOL 6.25 MG PO TABS
6.2500 mg | ORAL_TABLET | Freq: Two times a day (BID) | ORAL | Status: DC
  Administered 2021-10-24 – 2021-10-25 (×2): 6.25 mg via ORAL
  Filled 2021-10-24 (×2): qty 1

## 2021-10-24 MED ORDER — TRIAMTERENE-HCTZ 37.5-25 MG PO TABS
0.5000 | ORAL_TABLET | Freq: Every day | ORAL | Status: DC
  Administered 2021-10-24 – 2021-10-25 (×2): 0.5 via ORAL
  Filled 2021-10-24 (×2): qty 1

## 2021-10-24 MED ORDER — PHENYLEPHRINE HCL-NACL 20-0.9 MG/250ML-% IV SOLN
INTRAVENOUS | Status: DC | PRN
  Administered 2021-10-24: 25 ug/min via INTRAVENOUS

## 2021-10-24 MED ORDER — WARFARIN SODIUM 5 MG PO TABS
10.0000 mg | ORAL_TABLET | Freq: Once | ORAL | Status: AC
  Administered 2021-10-24: 10 mg via ORAL
  Filled 2021-10-24: qty 2

## 2021-10-24 MED ORDER — PROPOFOL 500 MG/50ML IV EMUL
INTRAVENOUS | Status: DC | PRN
  Administered 2021-10-24: 75 ug/kg/min via INTRAVENOUS

## 2021-10-24 MED ORDER — LISINOPRIL 20 MG PO TABS
40.0000 mg | ORAL_TABLET | Freq: Every day | ORAL | Status: DC
  Administered 2021-10-24 – 2021-10-25 (×2): 40 mg via ORAL
  Filled 2021-10-24 (×2): qty 2

## 2021-10-24 MED ORDER — BUPIVACAINE-EPINEPHRINE (PF) 0.5% -1:200000 IJ SOLN
INTRAMUSCULAR | Status: AC
  Filled 2021-10-24: qty 30

## 2021-10-24 MED ORDER — OXYCODONE HCL 5 MG PO TABS: 5.0000 mg | ORAL_TABLET | Freq: Once | ORAL | Status: DC | PRN

## 2021-10-24 MED ORDER — CEFAZOLIN SODIUM-DEXTROSE 2-4 GM/100ML-% IV SOLN
2.0000 g | INTRAVENOUS | Status: AC
  Administered 2021-10-24: 2 g via INTRAVENOUS
  Filled 2021-10-24: qty 100

## 2021-10-24 MED ORDER — ALBUTEROL SULFATE (2.5 MG/3ML) 0.083% IN NEBU
INHALATION_SOLUTION | RESPIRATORY_TRACT | Status: AC
  Filled 2021-10-24: qty 3

## 2021-10-24 MED ORDER — HYDROMORPHONE HCL 1 MG/ML IJ SOLN
0.5000 mg | INTRAMUSCULAR | Status: DC | PRN
  Administered 2021-10-25: 0.5 mg via INTRAVENOUS
  Filled 2021-10-24: qty 1

## 2021-10-24 MED ORDER — AMLODIPINE BESYLATE 10 MG PO TABS
10.0000 mg | ORAL_TABLET | Freq: Every day | ORAL | Status: DC
  Administered 2021-10-25: 10 mg via ORAL
  Filled 2021-10-24: qty 1

## 2021-10-24 MED ORDER — ONDANSETRON HCL 4 MG/2ML IJ SOLN: 4.0000 mg | Freq: Four times a day (QID) | INTRAMUSCULAR | Status: DC | PRN

## 2021-10-24 MED ORDER — FENTANYL CITRATE PF 50 MCG/ML IJ SOSY: 25.0000 ug | PREFILLED_SYRINGE | INTRAMUSCULAR | Status: DC | PRN

## 2021-10-24 MED ORDER — PROPOFOL 10 MG/ML IV BOLUS
INTRAVENOUS | Status: AC
  Filled 2021-10-24: qty 20

## 2021-10-24 MED ORDER — ACETAMINOPHEN 500 MG PO TABS
1000.0000 mg | ORAL_TABLET | Freq: Once | ORAL | Status: AC
  Filled 2021-10-24: qty 2

## 2021-10-24 MED ORDER — SODIUM CHLORIDE (PF) 0.9 % IJ SOLN
INTRAMUSCULAR | Status: AC
  Filled 2021-10-24: qty 20

## 2021-10-24 MED ORDER — METHOCARBAMOL 500 MG IVPB - SIMPLE MED
INTRAVENOUS | Status: AC
  Filled 2021-10-24: qty 50

## 2021-10-24 MED ORDER — AMITRIPTYLINE HCL 25 MG PO TABS
25.0000 mg | ORAL_TABLET | Freq: Every day | ORAL | Status: DC
  Administered 2021-10-24: 25 mg via ORAL
  Filled 2021-10-24 (×2): qty 1

## 2021-10-24 MED ORDER — VENLAFAXINE HCL ER 75 MG PO CP24
225.0000 mg | ORAL_CAPSULE | Freq: Every day | ORAL | Status: DC
  Administered 2021-10-25: 225 mg via ORAL
  Filled 2021-10-24: qty 1

## 2021-10-24 MED ORDER — BUPIVACAINE-EPINEPHRINE 0.25% -1:200000 IJ SOLN
INTRAMUSCULAR | Status: DC | PRN
  Administered 2021-10-24: 30 mL

## 2021-10-24 MED ORDER — BUPIVACAINE-EPINEPHRINE (PF) 0.5% -1:200000 IJ SOLN
INTRAMUSCULAR | Status: DC | PRN
  Administered 2021-10-24: 15 mL via PERINEURAL

## 2021-10-24 MED ORDER — ZOLPIDEM TARTRATE 5 MG PO TABS: 5.0000 mg | ORAL_TABLET | Freq: Every evening | ORAL | Status: DC | PRN

## 2021-10-24 MED ORDER — PHENOL 1.4 % MT LIQD: 1.0000 | OROMUCOSAL | Status: DC | PRN

## 2021-10-24 MED ORDER — ONDANSETRON HCL 4 MG/2ML IJ SOLN
INTRAMUSCULAR | Status: AC
  Filled 2021-10-24: qty 2

## 2021-10-24 MED ORDER — TRANEXAMIC ACID-NACL 1000-0.7 MG/100ML-% IV SOLN
1000.0000 mg | Freq: Once | INTRAVENOUS | Status: AC
  Administered 2021-10-24: 1000 mg via INTRAVENOUS
  Filled 2021-10-24: qty 100

## 2021-10-24 MED ORDER — PHENYLEPHRINE 40 MCG/ML (10ML) SYRINGE FOR IV PUSH (FOR BLOOD PRESSURE SUPPORT)
PREFILLED_SYRINGE | INTRAVENOUS | Status: AC
  Filled 2021-10-24: qty 10

## 2021-10-24 MED ORDER — ALBUTEROL SULFATE HFA 108 (90 BASE) MCG/ACT IN AERS
INHALATION_SPRAY | RESPIRATORY_TRACT | Status: DC | PRN
  Administered 2021-10-24 (×4): 2 via RESPIRATORY_TRACT

## 2021-10-24 MED ORDER — MIDAZOLAM HCL 2 MG/2ML IJ SOLN
INTRAMUSCULAR | Status: AC
  Filled 2021-10-24: qty 2

## 2021-10-24 MED ORDER — FENTANYL CITRATE PF 50 MCG/ML IJ SOSY
PREFILLED_SYRINGE | INTRAMUSCULAR | Status: AC
  Filled 2021-10-24: qty 1

## 2021-10-24 MED ORDER — OXYCODONE HCL 5 MG/5ML PO SOLN: 5.0000 mg | Freq: Once | ORAL | Status: DC | PRN

## 2021-10-24 MED ORDER — DEXAMETHASONE SODIUM PHOSPHATE 10 MG/ML IJ SOLN
INTRAMUSCULAR | Status: AC
  Filled 2021-10-24: qty 1

## 2021-10-24 MED ORDER — CEFAZOLIN SODIUM-DEXTROSE 2-4 GM/100ML-% IV SOLN
2.0000 g | Freq: Four times a day (QID) | INTRAVENOUS | Status: AC
  Administered 2021-10-24 (×2): 2 g via INTRAVENOUS
  Filled 2021-10-24 (×2): qty 100

## 2021-10-24 MED ORDER — ARIPIPRAZOLE 2 MG PO TABS
2.0000 mg | ORAL_TABLET | Freq: Every day | ORAL | Status: DC
  Administered 2021-10-25: 2 mg via ORAL
  Filled 2021-10-24: qty 1

## 2021-10-24 MED ORDER — TRANEXAMIC ACID-NACL 1000-0.7 MG/100ML-% IV SOLN
1000.0000 mg | INTRAVENOUS | Status: AC
  Administered 2021-10-24: 1000 mg via INTRAVENOUS
  Filled 2021-10-24: qty 100

## 2021-10-24 MED ORDER — ROSUVASTATIN CALCIUM 5 MG PO TABS
5.0000 mg | ORAL_TABLET | Freq: Every day | ORAL | Status: DC
  Administered 2021-10-24 – 2021-10-25 (×2): 5 mg via ORAL
  Filled 2021-10-24 (×2): qty 1

## 2021-10-24 MED ORDER — PANTOPRAZOLE SODIUM 40 MG PO TBEC
40.0000 mg | DELAYED_RELEASE_TABLET | Freq: Every day | ORAL | Status: DC
  Administered 2021-10-25: 40 mg via ORAL
  Filled 2021-10-24: qty 1

## 2021-10-24 MED ORDER — LACTATED RINGERS IV SOLN: INTRAVENOUS | Status: DC

## 2021-10-24 MED ORDER — MIDAZOLAM HCL 2 MG/2ML IJ SOLN
INTRAMUSCULAR | Status: DC | PRN
  Administered 2021-10-24 (×2): .5 mg via INTRAVENOUS

## 2021-10-24 MED ORDER — VENLAFAXINE HCL 75 MG PO TABS: 225.0000 mg | ORAL_TABLET | Freq: Every day | ORAL | Status: DC

## 2021-10-24 MED ORDER — OXYCODONE HCL 5 MG PO TABS
5.0000 mg | ORAL_TABLET | ORAL | Status: DC | PRN
  Administered 2021-10-24 – 2021-10-25 (×6): 10 mg via ORAL
  Filled 2021-10-24 (×6): qty 2

## 2021-10-24 MED ORDER — SENNOSIDES-DOCUSATE SODIUM 8.6-50 MG PO TABS: 1.0000 | ORAL_TABLET | Freq: Every evening | ORAL | Status: DC | PRN

## 2021-10-24 MED ORDER — DEXAMETHASONE SODIUM PHOSPHATE 10 MG/ML IJ SOLN: 8.0000 mg | Freq: Once | INTRAMUSCULAR | Status: DC

## 2021-10-24 MED ORDER — METHOCARBAMOL 500 MG PO TABS
500.0000 mg | ORAL_TABLET | Freq: Four times a day (QID) | ORAL | Status: DC | PRN
  Administered 2021-10-24 – 2021-10-25 (×3): 500 mg via ORAL
  Filled 2021-10-24 (×3): qty 1

## 2021-10-24 MED ORDER — FENTANYL CITRATE (PF) 250 MCG/5ML IJ SOLN
INTRAMUSCULAR | Status: AC
  Filled 2021-10-24: qty 5

## 2021-10-24 MED ORDER — GABAPENTIN 300 MG PO CAPS
300.0000 mg | ORAL_CAPSULE | Freq: Once | ORAL | Status: DC
  Filled 2021-10-24: qty 1

## 2021-10-24 MED ORDER — FERROUS SULFATE 325 (65 FE) MG PO TABS
325.0000 mg | ORAL_TABLET | Freq: Three times a day (TID) | ORAL | Status: DC
  Administered 2021-10-25: 325 mg via ORAL
  Filled 2021-10-24: qty 1

## 2021-10-24 MED ORDER — PHENYLEPHRINE HCL (PRESSORS) 10 MG/ML IV SOLN
INTRAVENOUS | Status: AC
  Filled 2021-10-24: qty 2

## 2021-10-24 MED ORDER — EPHEDRINE 5 MG/ML INJ
INTRAVENOUS | Status: AC
  Filled 2021-10-24: qty 5

## 2021-10-24 MED ORDER — METHOCARBAMOL 500 MG IVPB - SIMPLE MED
500.0000 mg | Freq: Four times a day (QID) | INTRAVENOUS | Status: DC | PRN
  Administered 2021-10-24: 500 mg via INTRAVENOUS
  Filled 2021-10-24: qty 50

## 2021-10-24 MED ORDER — PROPOFOL 10 MG/ML IV BOLUS
INTRAVENOUS | Status: DC | PRN
  Administered 2021-10-24: 100 mg via INTRAVENOUS
  Administered 2021-10-24 (×2): 20 mg via INTRAVENOUS

## 2021-10-24 MED ORDER — ALBUTEROL SULFATE (2.5 MG/3ML) 0.083% IN NEBU
2.5000 mg | INHALATION_SOLUTION | Freq: Once | RESPIRATORY_TRACT | Status: AC | PRN
  Administered 2021-10-24: 2.5 mg via RESPIRATORY_TRACT

## 2021-10-24 MED ORDER — BUPIVACAINE LIPOSOME 1.3 % IJ SUSP
INTRAMUSCULAR | Status: DC | PRN
  Administered 2021-10-24: 20 mL

## 2021-10-24 MED ORDER — SODIUM CHLORIDE 0.9% FLUSH
INTRAVENOUS | Status: DC | PRN
  Administered 2021-10-24: 20 mL

## 2021-10-24 MED ORDER — PROMETHAZINE HCL 25 MG/ML IJ SOLN: 6.2500 mg | INTRAMUSCULAR | Status: DC | PRN

## 2021-10-24 MED ORDER — SODIUM CHLORIDE 0.9 % IV SOLN: INTRAVENOUS | Status: DC

## 2021-10-24 MED ORDER — WARFARIN - PHARMACIST DOSING INPATIENT: Freq: Every day | Status: DC

## 2021-10-24 MED ORDER — ONDANSETRON HCL 4 MG/2ML IJ SOLN
INTRAMUSCULAR | Status: DC | PRN
  Administered 2021-10-24: 4 mg via INTRAVENOUS

## 2021-10-24 MED ORDER — WATER FOR IRRIGATION, STERILE IR SOLN
Status: DC | PRN
  Administered 2021-10-24: 2000 mL

## 2021-10-24 MED ORDER — ACETAMINOPHEN 500 MG PO TABS
1000.0000 mg | ORAL_TABLET | Freq: Once | ORAL | Status: AC
  Administered 2021-10-24: 1000 mg via ORAL

## 2021-10-24 MED ORDER — FUROSEMIDE 40 MG PO TABS
40.0000 mg | ORAL_TABLET | Freq: Every day | ORAL | Status: DC
  Administered 2021-10-24 – 2021-10-25 (×2): 40 mg via ORAL
  Filled 2021-10-24 (×2): qty 1

## 2021-10-24 MED ORDER — DIPHENHYDRAMINE HCL 12.5 MG/5ML PO ELIX: 12.5000 mg | ORAL_SOLUTION | ORAL | Status: DC | PRN

## 2021-10-24 MED ORDER — SODIUM CHLORIDE 0.9 % IR SOLN
Status: DC | PRN
  Administered 2021-10-24: 1000 mL

## 2021-10-24 MED ORDER — ACETAMINOPHEN 500 MG PO TABS
1000.0000 mg | ORAL_TABLET | Freq: Four times a day (QID) | ORAL | Status: AC
  Administered 2021-10-24 – 2021-10-25 (×4): 1000 mg via ORAL
  Filled 2021-10-24 (×4): qty 2

## 2021-10-24 MED ORDER — DEXAMETHASONE SODIUM PHOSPHATE 10 MG/ML IJ SOLN
INTRAMUSCULAR | Status: DC | PRN
  Administered 2021-10-24: 8 mg via INTRAVENOUS

## 2021-10-24 SURGICAL SUPPLY — 55 items
ARTISURF 10M VE L 6-9 CD KNEE (Knees) ×2 IMPLANT
BAG COUNTER SPONGE SURGICOUNT (BAG) IMPLANT
BAG ZIPLOCK 12X15 (MISCELLANEOUS) ×2 IMPLANT
BLADE SAGITTAL 13X1.27X60 (BLADE) ×2 IMPLANT
BLADE SAW SGTL 18X1.27X75 (BLADE) ×2 IMPLANT
BLADE SURG 15 STRL LF DISP TIS (BLADE) ×1 IMPLANT
BLADE SURG 15 STRL SS (BLADE) ×2
BLADE SURG SZ10 CARB STEEL (BLADE) ×4 IMPLANT
BNDG ELASTIC 6X10 VLCR STRL LF (GAUZE/BANDAGES/DRESSINGS) ×2 IMPLANT
BNDG ELASTIC 6X5.8 VLCR STR LF (GAUZE/BANDAGES/DRESSINGS) ×2 IMPLANT
BOWL SMART MIX CTS (DISPOSABLE) ×2 IMPLANT
CEMENT BONE SIMPLEX SPEEDSET (Cement) ×4 IMPLANT
COVER SURGICAL LIGHT HANDLE (MISCELLANEOUS) ×2 IMPLANT
CUFF TOURN SGL QUICK 34 (TOURNIQUET CUFF) ×2
CUFF TRNQT CYL 34X4.125X (TOURNIQUET CUFF) ×1 IMPLANT
DECANTER SPIKE VIAL GLASS SM (MISCELLANEOUS) ×4 IMPLANT
DRAPE INCISE IOBAN 66X45 STRL (DRAPES) ×6 IMPLANT
DRAPE U-SHAPE 47X51 STRL (DRAPES) ×2 IMPLANT
DRSG AQUACEL AG ADV 3.5X10 (GAUZE/BANDAGES/DRESSINGS) ×2 IMPLANT
DURAPREP 26ML APPLICATOR (WOUND CARE) ×4 IMPLANT
ELECT REM PT RETURN 15FT ADLT (MISCELLANEOUS) ×2 IMPLANT
FEMUR  CMT CCR STD SZ7 L KNEE (Knees) ×2 IMPLANT
FEMUR CMT CCR STD SZ7 L KNEE (Knees) ×1 IMPLANT
FEMUR CMTD CCR STD SZ7 L KNEE (Knees) ×1 IMPLANT
GLOVE SURG ENC TEXT LTX SZ7.5 (GLOVE) ×2 IMPLANT
GLOVE SURG ORTHO LTX SZ8 (GLOVE) ×6 IMPLANT
GLOVE SURG UNDER POLY LF SZ7.5 (GLOVE) ×2 IMPLANT
GLOVE SURG UNDER POLY LF SZ8.5 (GLOVE) ×4 IMPLANT
GOWN STRL REUS W/ TWL XL LVL3 (GOWN DISPOSABLE) ×2 IMPLANT
GOWN STRL REUS W/TWL XL LVL3 (GOWN DISPOSABLE) ×4
HANDPIECE INTERPULSE COAX TIP (DISPOSABLE) ×2
HOLDER FOLEY CATH W/STRAP (MISCELLANEOUS) ×2 IMPLANT
HOOD PEEL AWAY FLYTE STAYCOOL (MISCELLANEOUS) ×6 IMPLANT
KIT TURNOVER KIT A (KITS) IMPLANT
MANIFOLD NEPTUNE II (INSTRUMENTS) ×2 IMPLANT
NEEDLE HYPO 21X1.5 SAFETY (NEEDLE) ×2 IMPLANT
NS IRRIG 1000ML POUR BTL (IV SOLUTION) ×2 IMPLANT
PACK TOTAL KNEE CUSTOM (KITS) ×2 IMPLANT
PROTECTOR NERVE ULNAR (MISCELLANEOUS) ×2 IMPLANT
SET HNDPC FAN SPRY TIP SCT (DISPOSABLE) ×1 IMPLANT
STEM POLY PAT PLY 32M KNEE (Knees) ×2 IMPLANT
STEM TIBIA 5 DEG SZ D L KNEE (Knees) ×1 IMPLANT
STRIP CLOSURE SKIN 1/2X4 (GAUZE/BANDAGES/DRESSINGS) ×2 IMPLANT
SUT BONE WAX W31G (SUTURE) ×2 IMPLANT
SUT MNCRL AB 3-0 PS2 18 (SUTURE) ×2 IMPLANT
SUT STRATAFIX 0 PDS 27 VIOLET (SUTURE) ×2
SUT STRATAFIX PDS+ 0 24IN (SUTURE) ×2 IMPLANT
SUT VIC AB 1 CT1 36 (SUTURE) ×2 IMPLANT
SUTURE STRATFX 0 PDS 27 VIOLET (SUTURE) ×1 IMPLANT
SYR 30ML LL (SYRINGE) ×4 IMPLANT
TAPE STRIPS DRAPE STRL (GAUZE/BANDAGES/DRESSINGS) ×2 IMPLANT
TIBIA STEM 5 DEG SZ D L KNEE (Knees) ×2 IMPLANT
TRAY FOLEY MTR SLVR 16FR STAT (SET/KITS/TRAYS/PACK) ×2 IMPLANT
WATER STERILE IRR 1000ML POUR (IV SOLUTION) ×4 IMPLANT
WRAP KNEE MAXI GEL POST OP (GAUZE/BANDAGES/DRESSINGS) ×2 IMPLANT

## 2021-10-24 NOTE — Progress Notes (Signed)
ANTICOAGULATION CONSULT NOTE  Pharmacy Consult for warfarin Indication: Hx recurrent DVT  No Known Allergies  Patient Measurements: Height: 5' 4.96" (165 cm) Weight: 98 kg (216 lb 0.8 oz) IBW/kg (Calculated) : 56.91  Vital Signs: Temp: 98 F (36.7 C) (11/07 1032) Temp Source: Oral (11/07 1032) BP: 124/67 (11/07 1032) Pulse Rate: 76 (11/07 1032)  Labs: Recent Labs    10/24/21 0600  LABPROT 13.1  INR 1.0    Estimated Creatinine Clearance: 87.6 mL/min (by C-G formula based on SCr of 0.71 mg/dL).   Medical History: Past Medical History:  Diagnosis Date   Anxiety    Arthritis    Depression    DVT (deep venous thrombosis) (HCC)    H/O endarterectomy    aorta - 09/01/21   Heart murmur    Hypertension    Peripheral vascular disease (HCC)    aorta iliac disease   PONV (postoperative nausea and vomiting)     Medications:  Medications Prior to Admission  Medication Sig Dispense Refill Last Dose   ALPRAZolam (XANAX) 1 MG tablet Take 1 mg by mouth 3 (three) times daily as needed for anxiety.   10/23/2021   amitriptyline (ELAVIL) 25 MG tablet Take 25 mg by mouth at bedtime.   10/23/2021   amLODipine (NORVASC) 10 MG tablet Take 10 mg by mouth daily.    10/24/2021 at 0320   ARIPiprazole (ABILIFY) 2 MG tablet Take 2 mg by mouth daily.   10/24/2021 at 0320   carvedilol (COREG) 6.25 MG tablet Take 6.25 mg by mouth 2 (two) times daily.  6 10/24/2021 at 0320   cloNIDine (CATAPRES) 0.1 MG tablet Take 0.1 mg by mouth 2 (two) times daily.    10/24/2021 at 0320   furosemide (LASIX) 40 MG tablet Take 40 mg by mouth daily.   10/23/2021   gabapentin (NEURONTIN) 300 MG capsule Take 300 mg by mouth 3 (three) times daily.  2 10/24/2021 at 0320   lisinopril (PRINIVIL,ZESTRIL) 40 MG tablet Take 40 mg by mouth daily.    10/23/2021   Multiple Minerals-Vitamins (CAL MAG ZINC +D3) TABS Take 3 tablets by mouth daily.   10/23/2021   Multiple Vitamins-Minerals (PRESERVISION AREDS 2+MULTI VIT PO) Take 1  capsule by mouth daily.   10/23/2021   Potassium 99 MG TABS Take 99 mg by mouth daily.   10/23/2021   rosuvastatin (CRESTOR) 5 MG tablet Take 5 mg by mouth daily.   10/23/2021   triamterene-hydrochlorothiazide (MAXZIDE-25) 37.5-25 MG tablet Take 0.5 tablets by mouth daily.   10/23/2021   venlafaxine XR (EFFEXOR-XR) 75 MG 24 hr capsule Take 225 mg by mouth daily with breakfast.   10/24/2021   warfarin (COUMADIN) 10 MG tablet Take 10 mg by mouth See admin instructions. Mon and Thurs   10/17/2021   warfarin (COUMADIN) 7.5 MG tablet Take 7.5 mg by mouth See admin instructions. Evern Bio, Wed, Fri and Sat   10/17/2021   Scheduled:   acetaminophen  1,000 mg Oral Q6H   albuterol       amitriptyline  25 mg Oral QHS   [START ON 10/25/2021] amLODipine  10 mg Oral Daily   [START ON 10/25/2021] ARIPiprazole  2 mg Oral Daily   carvedilol  6.25 mg Oral BID   cloNIDine  0.1 mg Oral BID   [START ON 10/25/2021] dexamethasone (DECADRON) injection  10 mg Intravenous Once   docusate sodium  100 mg Oral BID   [START ON 10/25/2021] ferrous sulfate  325 mg Oral TID PC  furosemide  40 mg Oral Daily   gabapentin  300 mg Oral TID   lisinopril  40 mg Oral Daily   [START ON 10/25/2021] pantoprazole  40 mg Oral Daily   rosuvastatin  5 mg Oral Daily   triamterene-hydrochlorothiazide  0.5 tablet Oral Daily   [START ON 10/25/2021] venlafaxine XR  225 mg Oral Q breakfast   PRN: ALPRAZolam, alum & mag hydroxide-simeth, bisacodyl, diphenhydrAMINE, HYDROmorphone (DILAUDID) injection, menthol-cetylpyridinium **OR** phenol, methocarbamol **OR** methocarbamol (ROBAXIN) IV, metoCLOPramide **OR** metoCLOPramide (REGLAN) injection, ondansetron **OR** ondansetron (ZOFRAN) IV, oxyCODONE, senna-docusate, sodium phosphate, zolpidem  Assessment: 60 yoF with PMH PVD s/p aorto-iliac endarterectomy in Sept, recurrent DVT taking warfarin PTA, admitted for left total knee arthroplasty.   Baseline INR non-therapeutic after holding warfarin x 1  wk Prior anticoagulation: warfarin 10 mg on Mon & Thurs, 7.5 mg all other days; last dose 10/31  Significant events:  Today, 10/24/2021: CBC: ordered for tomorrow; Hgb slightly low at 10.3 on pre-op labs; EBL 300 cc for this procedure. Pre-op plt WNL INR subtherapeutic Major drug interactions: none currently No bleeding issues per nursing Eating 100% of meals  Goal of Therapy: INR 2-3  Plan: Warfarin 10 mg PO tonight at 16:00 - no postop CBC but no s/s of excessive anemia at this time Daily INR CBC at least q72 hr while on warfarin Monitor for signs of bleeding or thrombosis Given recurrent DVT, consider adding VTE ppx with Lovenox if pt will need to stay past POD1   Bernadene Person, PharmD, BCPS [phone] 10/24/2021, 2:14 PM

## 2021-10-24 NOTE — Progress Notes (Signed)
Orthopedic Tech Progress Note Patient Details:  Rachel Chen Oct 29, 1961 [id number]  CPM Left Knee CPM Left Knee: On Left Knee Flexion (Degrees): 90 Left Knee Extension (Degrees): 0 Additional Comments: foot roll  Post Interventions Patient Tolerated: Well Instructions Provided: Care of device, Adjustment of device  Saul Fordyce 10/24/2021, 9:19 AM

## 2021-10-24 NOTE — Anesthesia Procedure Notes (Signed)
Procedure Name: LMA Insertion Date/Time: 10/24/2021 7:50 AM Performed by: Minerva Ends, CRNA Pre-anesthesia Checklist: Patient identified, Emergency Drugs available, Suction available and Patient being monitored Patient Re-evaluated:Patient Re-evaluated prior to induction Oxygen Delivery Method: Circle System Utilized Preoxygenation: Pre-oxygenation with 100% oxygen Induction Type: IV induction Ventilation: Mask ventilation without difficulty LMA: LMA inserted LMA Size: 4.0 Number of attempts: 1 Placement Confirmation: positive ETCO2 Tube secured with: Tape Dental Injury: Teeth and Oropharynx as per pre-operative assessment  Comments: Smooth IV induction Howze- LMA insertion AM CRNA- atraumatic- teeth as preop- many missing and front teeth loose- Howze adjusted LMA after CRNA inserted- Bilat BS

## 2021-10-24 NOTE — Anesthesia Procedure Notes (Signed)
Date/Time: 10/24/2021 8:51 AM Performed by: Minerva Ends, CRNA Oxygen Delivery Method: Simple face mask Placement Confirmation: positive ETCO2 and breath sounds checked- equal and bilateral Dental Injury: Teeth and Oropharynx as per pre-operative assessment

## 2021-10-24 NOTE — Anesthesia Procedure Notes (Signed)
Date/Time: 10/24/2021 6:58 AM Performed by: Minerva Ends, CRNA Pre-anesthesia Checklist: Patient identified, Emergency Drugs available, Suction available, Patient being monitored and Timeout performed Patient Re-evaluated:Patient Re-evaluated prior to induction Oxygen Delivery Method: Nasal cannula Placement Confirmation: positive ETCO2 and breath sounds checked- equal and bilateral Dental Injury: Teeth and Oropharynx as per pre-operative assessment

## 2021-10-24 NOTE — Anesthesia Postprocedure Evaluation (Signed)
Anesthesia Post Note  Patient: Rachel Chen  Procedure(s) Performed: TOTAL KNEE ARTHROPLASTY (Left: Knee)     Patient location during evaluation: PACU Anesthesia Type: General Level of consciousness: awake and alert and oriented Pain management: pain level controlled Vital Signs Assessment: post-procedure vital signs reviewed and stable Respiratory status: spontaneous breathing, nonlabored ventilation and respiratory function stable Cardiovascular status: blood pressure returned to baseline Postop Assessment: no apparent nausea or vomiting Anesthetic complications: no   No notable events documented.  Last Vitals:  Vitals:   10/24/21 1015 10/24/21 1032  BP: 137/82 124/67  Pulse: 79 76  Resp: (!) 22 20  Temp:  36.7 C  SpO2: 92% 92%    Last Pain:  Vitals:   10/24/21 1032  TempSrc: Oral  PainSc: 2                  Shanda Howells

## 2021-10-24 NOTE — H&P (Signed)
Rachel Chen MRN:  [id number] DOB/SEX:  1961-08-28/female  CHIEF COMPLAINT:  Painful left Knee  HISTORY: Patient is a 61 y.o. female presented with a history of pain in the left knee. Onset of symptoms was gradual starting a few years ago with gradually worsening course since that time. Patient has been treated conservatively with over-the-counter NSAIDs and activity modification. Patient currently rates pain in the knee at 10 out of 10 with activity. There is pain at night.  PAST MEDICAL HISTORY: There are no problems to display for this patient.  Past Medical History:  Diagnosis Date   Anxiety    Arthritis    Depression    DVT (deep venous thrombosis) (HCC)    H/O endarterectomy    aorta - 09/01/21   Heart murmur    Hypertension    Peripheral vascular disease (HCC)    aorta iliac disease   PONV (postoperative nausea and vomiting)    Past Surgical History:  Procedure Laterality Date   " clean out of abdominal aorta "      ABDOMINAL HYSTERECTOMY     BACK SURGERY     LOWER EXTREMITY ANGIOGRAPHY N/A 08/26/2018   Procedure: LOWER EXTREMITY ANGIOGRAPHY;  Surgeon: Maeola Harman, MD;  Location: [facility] INVASIVE CV LAB;  Service: Cardiovascular;  Laterality: N/A;   PERIPHERAL VASCULAR INTERVENTION Right 08/26/2018   Procedure: PERIPHERAL VASCULAR INTERVENTION;  Surgeon: Maeola Harman, MD;  Location: [facility] INVASIVE CV LAB;  Service: Cardiovascular;  Laterality: Right;  Common iliac   right knee arthroscopy      torn hamstrin surgery on right leg        MEDICATIONS:   Medications Prior to Admission  Medication Sig Dispense Refill Last Dose   ALPRAZolam (XANAX) 1 MG tablet Take 1 mg by mouth 3 (three) times daily as needed for anxiety.   10/23/2021   amitriptyline (ELAVIL) 25 MG tablet Take 25 mg by mouth at bedtime.   10/23/2021   amLODipine (NORVASC) 10 MG tablet Take 10 mg by mouth daily.    10/24/2021 at 0320   ARIPiprazole (ABILIFY) 2 MG tablet Take 2 mg by mouth  daily.   10/24/2021 at 0320   carvedilol (COREG) 6.25 MG tablet Take 6.25 mg by mouth 2 (two) times daily.  6 10/24/2021 at 0320   cloNIDine (CATAPRES) 0.1 MG tablet Take 0.1 mg by mouth 2 (two) times daily.    10/24/2021 at 0320   furosemide (LASIX) 40 MG tablet Take 40 mg by mouth daily.   10/23/2021   gabapentin (NEURONTIN) 300 MG capsule Take 300 mg by mouth 3 (three) times daily.  2 10/24/2021 at 0320   lisinopril (PRINIVIL,ZESTRIL) 40 MG tablet Take 40 mg by mouth daily.    10/23/2021   Multiple Minerals-Vitamins (CAL MAG ZINC +D3) TABS Take 3 tablets by mouth daily.   10/23/2021   Multiple Vitamins-Minerals (PRESERVISION AREDS 2+MULTI VIT PO) Take 1 capsule by mouth daily.   10/23/2021   Potassium 99 MG TABS Take 99 mg by mouth daily.   10/23/2021   rosuvastatin (CRESTOR) 5 MG tablet Take 5 mg by mouth daily.   10/23/2021   triamterene-hydrochlorothiazide (MAXZIDE-25) 37.5-25 MG tablet Take 0.5 tablets by mouth daily.   10/23/2021   venlafaxine (EFFEXOR) 75 MG tablet Take 225 mg by mouth daily.    10/24/2021 at 0320   warfarin (COUMADIN) 10 MG tablet Take 10 mg by mouth See admin instructions. Mon and Thurs   10/17/2021   warfarin (COUMADIN) 7.5 MG tablet  Take 7.5 mg by mouth See admin instructions. Evern Bio, Wed, Fri and Sat   10/17/2021    ALLERGIES:  No Known Allergies  REVIEW OF SYSTEMS:  A comprehensive review of systems was negative except for: Musculoskeletal: positive for arthralgias and bone pain   FAMILY HISTORY:  History reviewed. No pertinent family history.  SOCIAL HISTORY:   Social History   Tobacco Use   Smoking status: Former    Packs/day: 1.00    Types: Cigarettes    Quit date: 12/11/2019    Years since quitting: 1.8   Smokeless tobacco: Never  Substance Use Topics   Alcohol use: Yes    Comment: occassional     EXAMINATION:  Vital signs in last 24 hours: Temp:  [98.5 F (36.9 C)] 98.5 F (36.9 C) (11/07 0532) Pulse Rate:  [65] 65 (11/07 0532) Resp:  [17] 17  (11/07 0532) BP: (127)/(66) 127/66 (11/07 0532) SpO2:  [94 %] 94 % (11/07 0532)  BP 127/66   Pulse 65   Temp 98.5 F (36.9 C) (Oral)   Resp 17   SpO2 94%   General Appearance:    Alert, cooperative, no distress, appears stated age  Head:    Normocephalic, without obvious abnormality, atraumatic  Eyes:    PERRL, conjunctiva/corneas clear, EOM's intact, fundi    benign, both eyes  Ears:    Normal TM's and external ear canals, both ears  Nose:   Nares normal, septum midline, mucosa normal, no drainage    or sinus tenderness  Throat:   Lips, mucosa, and tongue normal; teeth and gums normal  Neck:   Supple, symmetrical, trachea midline, no adenopathy;    thyroid:  no enlargement/tenderness/nodules; no carotid   bruit or JVD  Back:     Symmetric, no curvature, ROM normal, no CVA tenderness  Lungs:     Clear to auscultation bilaterally, respirations unlabored  Chest Wall:    No tenderness or deformity   Heart:    Regular rate and rhythm, S1 and S2 normal, no murmur, rub   or gallop  Breast Exam:    No tenderness, masses, or nipple abnormality  Abdomen:     Soft, non-tender, bowel sounds active all four quadrants,    no masses, no organomegaly  Genitalia:    Normal female without lesion, discharge or tenderness  Rectal:    Normal tone,no masses or tenderness;   guaiac negative stool  Extremities:   Extremities normal, atraumatic, no cyanosis or edema  Pulses:   2+ and symmetric all extremities  Skin:   Skin color, texture, turgor normal, no rashes or lesions  Lymph nodes:   Cervical, supraclavicular, and axillary nodes normal  Neurologic:   CNII-XII intact, normal strength, sensation and reflexes    throughout    Musculoskeletal:  ROM 0-120, Ligaments intact,  Imaging Review Plain radiographs demonstrate severe degenerative joint disease of the left knee. The overall alignment is neutral. The bone quality appears to be good for age and reported activity  level.  Assessment/Plan: Primary osteoarthritis, left knee   The patient history, physical examination and imaging studies are consistent with advanced degenerative joint disease of the left knee. The patient has failed conservative treatment.  The clearance notes were reviewed.  After discussion with the patient it was felt that Total Knee Replacement was indicated. The procedure,  risks, and benefits of total knee arthroplasty were presented and reviewed. The risks including but not limited to aseptic loosening, infection, blood clots, vascular injury, stiffness, patella  tracking problems complications among others were discussed. The patient acknowledged the explanation, agreed to proceed with the plan.  Preoperative templating of the joint replacement has been completed, documented, and submitted to the Operating Room personnel in order to optimize intra-operative equipment management.    Patient's anticipated LOS is less than 2 midnights, meeting these requirements: - Younger than 73 - Lives within 1 hour of care - Has a competent adult at home to recover with post-op recover - NO history of  - Chronic pain requiring opiods  - Diabetes  - Coronary Artery Disease  - Heart failure  - Heart attack  - Stroke  - DVT/VTE  - Cardiac arrhythmia  - Respiratory Failure/COPD  - Renal failure  - Anemia  - Advanced Liver disease     Guy Sandifer 10/24/2021, 6:59 AM

## 2021-10-24 NOTE — Anesthesia Procedure Notes (Signed)
Anesthesia Regional Block: Adductor canal block   Pre-Anesthetic Checklist: , timeout performed,  Correct Patient, Correct Site, Correct Laterality,  Correct Procedure, Correct Position, site marked,  Risks and benefits discussed,  Pre-op evaluation,  At surgeon's request and post-op pain management  Laterality: Left  Prep: Maximum Sterile Barrier Precautions used, chloraprep       Needles:  Injection technique: Single-shot  Needle Type: Echogenic Stimulator Needle     Needle Length: 9cm  Needle Gauge: 22     Additional Needles:   Procedures:,,,, ultrasound used (permanent image in chart),,    Narrative:  Start time: 10/24/2021 7:02 AM End time: 10/24/2021 7:05 AM Injection made incrementally with aspirations every 5 mL.  Performed by: Personally  Anesthesiologist: Kaylyn Layer, MD  Additional Notes: Risks, benefits, and alternative discussed. Patient gave consent for procedure. Patient prepped and draped in sterile fashion. Sedation administered, patient remains easily responsive to voice. Relevant anatomy identified with ultrasound guidance. Local anesthetic given in 5cc increments with no signs or symptoms of intravascular injection. No pain or paraesthesias with injection. Patient monitored throughout procedure with signs of LAST or immediate complications. Tolerated well. Ultrasound image placed in chart.  Amalia Greenhouse, MD

## 2021-10-24 NOTE — Transfer of Care (Signed)
Immediate Anesthesia Transfer of Care Note  Patient: Rachel Chen  Procedure(s) Performed: TOTAL KNEE ARTHROPLASTY (Left: Knee)  Patient Location: PACU  Anesthesia Type:General  Level of Consciousness: sedated  Airway & Oxygen Therapy: Patient Spontanous Breathing and Patient connected to face mask oxygen  Post-op Assessment: Report given to RN and Post -op Vital signs reviewed and stable  Post vital signs: Reviewed and stable  Last Vitals:  Vitals Value Taken Time  BP 116/66 10/24/21 0902  Temp    Pulse 72 10/24/21 0905  Resp 17 10/24/21 0905  SpO2 97 % 10/24/21 0905  Vitals shown include unvalidated device data.  Last Pain:  Vitals:   10/24/21 0532  TempSrc: Oral         Complications: No notable events documented.

## 2021-10-24 NOTE — Anesthesia Procedure Notes (Signed)
Spinal  Patient location during procedure: OR Start time: 10/24/2021 7:20 AM End time: 10/24/2021 7:26 AM Reason for block: surgical anesthesia Staffing Performed: anesthesiologist  Anesthesiologist: Kaylyn Layer, MD Preanesthetic Checklist Completed: patient identified, IV checked, risks and benefits discussed, surgical consent, monitors and equipment checked, pre-op evaluation and timeout performed Spinal Block Patient position: sitting Prep: DuraPrep and site prepped and draped Patient monitoring: continuous pulse ox, blood pressure and heart rate Approach: midline Location: L3-4 Injection technique: single-shot Needle Needle type: [place]  Needle gauge: 22 G Needle length: 9 cm Assessment Events: failed spinal and CSF return Additional Notes Risks, benefits, and alternative discussed. Patient gave consent to procedure. Prepped and draped in sitting position. Patient sedated but responsive to voice. Difficulty due to previous lumbar surgery, 3 attempts required. Excellent flow of clear CSF obtained at L2-3. Positive terminal aspiration. No pain or paraesthesias with injection. Patient tolerated procedure well. Vital signs stable. Amalia Greenhouse, MD  Addendum: Patient moving with incision, presumed failed spinal. Bupivacaine lot # noted to assess for trend in problems with medication.

## 2021-10-24 NOTE — Evaluation (Signed)
Physical Therapy Evaluation Patient Details Name: Rachel Chen MRN: [id number] DOB: [birth year] Today's Date: 10/24/2021  History of Present Illness  Patient is 60 y.o. female s/p Lt TKA on 10/24/21 with PMH significant for OA, anxiety, depression, DVT, HTN, PVD.  Clinical Impression  Rachel Chen is a 60 y.o. female POD 0 s/p Lt TKA. Patient reports independence with mobility at baseline. Patient is now limited by functional impairments (see PT problem list below) and requires min assist for transfers and gait with RW. Patient was able to ambulate ~8 feet with RW and min assist. Patient instructed in exercise to facilitate circulation to manage edema and reduce risk of DVT. Patient will benefit from continued skilled PT interventions to address impairments and progress towards PLOF. Acute PT will follow to progress mobility and stair training in preparation for safe discharge home.        Recommendations for follow up therapy are one component of a multi-disciplinary discharge planning process, led by the attending physician.  Recommendations may be updated based on patient status, additional functional criteria and insurance authorization.  Follow Up Recommendations Follow physician's recommendations for discharge plan and follow up therapies    Assistance Recommended at Discharge Frequent or constant Supervision/Assistance  Functional Status Assessment Patient has had a recent decline in their functional status and demonstrates the ability to make significant improvements in function in a reasonable and predictable amount of time.  Equipment Recommendations  None recommended by PT    Recommendations for Other Services       Precautions / Restrictions Precautions Precautions: Fall Restrictions Weight Bearing Restrictions: No LLE Weight Bearing: Weight bearing as tolerated      Mobility  Bed Mobility Overal bed mobility: Needs Assistance Bed Mobility: Supine to Sit      Supine to sit: Min assist;HOB elevated     General bed mobility comments: cues for use of bed rail and sequencing to bring LE's off EOB first. Assist to fully raise trunk up at edge.    Transfers Overall transfer level: Needs assistance Equipment used: Rolling walker (2 wheels) Transfers: Sit to/from Stand;Bed to chair/wheelchair/BSC Sit to Stand: Min assist Stand pivot transfers: Min assist         General transfer comment: cues for hand placement on RW for power up, min assist to complete rise and steady. Assist to manage walker position and pivot bed>chair with several small steps in walker.    Ambulation/Gait Ambulation/Gait assistance: Min assist Gait Distance (Feet): 8 Feet Assistive device: Rolling walker (2 wheels) Gait Pattern/deviations: Step-to pattern;Decreased stride length;Decreased stance time - left;Decreased weight shift to left;Antalgic Gait velocity: decr     General Gait Details: cues for step to pattern and proximity to RW, no overt LOB or buckling at Lt knee with pt using UE's well. distance limited by pain.  Stairs            Wheelchair Mobility    Modified Rankin (Stroke Patients Only)       Balance Overall balance assessment: Needs assistance Sitting-balance support: Feet supported Sitting balance-Leahy Scale: Fair     Standing balance support: Reliant on assistive device for balance;Bilateral upper extremity supported;During functional activity Standing balance-Leahy Scale: Poor                               Pertinent Vitals/Pain Pain Assessment: 0-10 Pain Score: 7  Pain Location: Lt knee Pain Descriptors / Indicators: Aching;Discomfort Pain Intervention(s): Limited  activity within patient's tolerance;Monitored during session;Repositioned;Ice applied    Home Living Family/patient expects to be discharged to:: Private residence Living Arrangements: Spouse/significant other Available Help at Discharge: Family Type  of Home: House Home Access: Stairs to enter Entrance Stairs-Rails: Doctor, general practice of Steps: 4   Home Layout: One level Home Equipment: Shower seat - built Char[employer] fundraiser (2 wheels);Cane - single point;Grab bars - tub/shower      Prior Function Prior Level of Function : Independent/Modified Independent             Mobility Comments: using walking stick outside of home       Hand Dominance   Dominant Hand: Right    Extremity/Trunk Assessment   Upper Extremity Assessment Upper Extremity Assessment: Overall WFL for tasks assessed    Lower Extremity Assessment Lower Extremity Assessment: LLE deficits/detail LLE Deficits / Details: good quad activaitio, no extensor lag with SLR, height limited due to pain LLE Sensation: WNL LLE Coordination: WNL    Cervical / Trunk Assessment Cervical / Trunk Assessment: Normal  Communication   Communication: No difficulties  Cognition Arousal/Alertness: Awake/alert Behavior During Therapy: WFL for tasks assessed/performed Overall Cognitive Status: Within Functional Limits for tasks assessed                                          General Comments      Exercises Total Joint Exercises Ankle Circles/Pumps: AROM;Both;20 reps;Seated   Assessment/Plan    PT Assessment Patient needs continued PT services  PT Problem List Decreased strength;Decreased range of motion;Decreased activity tolerance;Decreased balance;Decreased mobility;Decreased knowledge of use of DME;Decreased knowledge of precautions;Pain       PT Treatment Interventions DME instruction;Gait training;Functional mobility training;Therapeutic activities;Stair training;Therapeutic exercise;Balance training;Patient/family education    PT Goals (Current goals can be found in the Care Plan section)  Acute Rehab PT Goals Patient Stated Goal: get well enough to attend mothers funeral service Thursday this week. PT Goal Formulation:  With patient Time For Goal Achievement: 10/31/21 Potential to Achieve Goals: Good    Frequency 7X/week   Barriers to discharge        Co-evaluation               AM-PAC PT "6 Clicks" Mobility  Outcome Measure Help needed turning from your back to your side while in a flat bed without using bedrails?: A Little Help needed moving from lying on your back to sitting on the side of a flat bed without using bedrails?: A Little Help needed moving to and from a bed to a chair (including a wheelchair)?: A Little Help needed standing up from a chair using your arms (e.g., wheelchair or bedside chair)?: A Little Help needed to walk in hospital room?: A Little Help needed climbing 3-5 steps with a railing? : A Lot 6 Click Score: 17    End of Session Equipment Utilized During Treatment: Gait belt;Oxygen Activity Tolerance: Patient tolerated treatment well;Patient limited by pain Patient left: in chair;with call bell/phone within reach;with chair alarm set Nurse Communication: Mobility status PT Visit Diagnosis: Muscle weakness (generalized) (M62.81);Difficulty in walking, not elsewhere classified (R26.2);Pain Pain - Right/Left: Left Pain - part of body: Knee    Time: 1352-1417 PT Time Calculation (min) (ACUTE ONLY): 25 min   Charges:   PT Evaluation $PT Eval Low Complexity: 1 Low PT Treatments $Therapeutic Activity: 8-22 mins  Wynn Maudlin, DPT Acute Rehabilitation Services Office [phone] Pager [phone]   Anitra Lauth 10/24/2021, 2:40 PM

## 2021-10-24 NOTE — Op Note (Addendum)
TOTAL KNEE REPLACEMENT OPERATIVE NOTE:  10/24/2021  11:32 AM  PATIENT:  Rachel Chen  60 y.o. female  PRE-OPERATIVE DIAGNOSIS:  Osteoarthritis of left knee M17.12  POST-OPERATIVE DIAGNOSIS:  Osteoarthritis of left knee M17.12  PROCEDURE:  Procedure(s): TOTAL KNEE ARTHROPLASTY  SURGEON:  Surgeon(s): Dannielle Huh, MD  PHYSICIAN ASSISTANT: Laurier Nancy, PA-C  ANESTHESIA:   general  SPECIMEN: None  COUNTS:  Correct  TOURNIQUET:   Total Tourniquet Time Documented: Thigh (Left) - 43 minutes Total: Thigh (Left) - 43 minutes   DICTATION:  Indication for procedure:    The patient is a 60 y.o. female who has failed conservative treatment for Osteoarthritis of left knee M17.12.  Informed consent was obtained prior to anesthesia. The risks versus benefits of the operation were explain and in a way the patient can, and did, understand.    Description of procedure:     The patient was taken to the operating room and placed under anesthesia.  The patient was positioned in the usual fashion taking care that all body parts were adequately padded and/or protected.  A tourniquet was applied and the leg prepped and draped in the usual sterile fashion.  The extremity was exsanguinated with the esmarch and tourniquet inflated to 250 mmHg.  Pre-operative range of motion was normal.    A midline incision approximately 6-7 inches long was made with a #10 blade.  A new blade was used to make a parapatellar arthrotomy going 2-3 cm into the quadriceps tendon, over the patella, and alongside the medial aspect of the patellar tendon.  A synovectomy was then performed with the #10 blade and forceps. I then elevated the deep MCL off the medial tibial metaphysis subperiosteally around to the semimembranosus attachment.    I everted the patella and used calipers to measure patellar thickness.  I used the reamer to ream down to appropriate thickness to recreate the native thickness.  I then removed  excess bone with the rongeur and sagittal saw.  I used the appropriately sized template and drilled the three lug holes.  I then put the trial in place and measured the thickness with the calipers to ensure recreation of the native thickness.  The trial was then removed and the patella subluxed and the knee brought into flexion.  A homan retractor was place to retract and protect the patella and lateral structures.  A Z-retractor was place medially to protect the medial structures.  The extra-medullary alignment system was used to make cut the tibial articular surface perpendicular to the anamotic axis of the tibia and in 3 degrees of posterior slope.  The cut surface and alignment jig was removed.  I then used the intramedullary alignment guide to make a valgus cut on the distal femur.  I then marked out the epicondylar axis on the distal femur.   I then used the anterior referencing sizer and measured the femur to be a size 7.  The 4-In-1 cutting block was screwed into place in external rotation matching the posterior condylar angle, making our cuts perpendicular to the epicondylar axis.  Anterior, posterior and chamfer cuts were made with the sagittal saw.  The cutting block and cut pieces were removed.  A lamina spreader was placed in 90 degrees of flexion.  The ACL, PCL, menisci, and posterior condylar osteophytes were removed.  A 10 mm spacer blocked was found to offer good flexion and extension gap balance after minimal in degree releasing.   The scoop retractor was then placed  and the femoral finishing block was pinned in place.  The small sagittal saw was used as well as the lug drill to finish the femur.  The block and cut surfaces were removed and the medullary canal hole filled with autograft bone from the cut pieces.  The tibia was delivered forward in deep flexion and external rotation.  A size D tray was selected and pinned into place centered on the medial 1/3 of the tibial tubercle.  The  reamer and keel was used to prepare the tibia through the tray.    I then trialed with the size 7 femur, size D tibia, a 10 mm insert and the 32 patella.  I had excellent flexion/extension gap balance, excellent patella tracking.  Flexion was full and beyond 120 degrees; extension was zero.  These components were chosen and the staff opened them to me on the back table while the knee was lavaged copiously and the cement mixed.  The soft tissue was infiltrated with 60cc of exparel 1.3% through a 21 gauge needle.  I cemented in the components and removed all excess cement.  The polyethylene tibial component was snapped into place and the knee placed in extension while cement was hardening.  The capsule was infilltrated with a 60cc exparel/marcaine/saline mixture.   Once the cement was hard, the tourniquet was let down.  Hemostasis was obtained.  The arthrotomy was closed using a #1 stratofix running suture.  The deep soft tissues were closed with #0 vicryls and the subcuticular layer closed with #2-0 vicryl.  The skin was reapproximated and closed with 3.0 Monocryl.  The wound was covered with steristrips, aquacel dressing, and a TED stocking.   The patient was then awakened, extubated, and taken to the recovery room in stable condition.  BLOOD LOSS:  0000000 COMPLICATIONS:  None.  PLAN OF CARE: Admit for overnight observation  PATIENT DISPOSITION:  PACU - hemodynamically stable.    Please fax a copy of this op note to my office at [phone] (please only include page 1 and 2 of the Case Information op note)

## 2021-10-24 NOTE — Anesthesia Procedure Notes (Signed)
Date/Time: 10/24/2021 7:14 AM Performed by: Minerva Ends, CRNA Pre-anesthesia Checklist: Patient identified, Emergency Drugs available, Suction available, Patient being monitored and Timeout performed Patient Re-evaluated:Patient Re-evaluated prior to induction Oxygen Delivery Method: Simple face mask Placement Confirmation: positive ETCO2 and breath sounds checked- equal and bilateral Dental Injury: Teeth and Oropharynx as per pre-operative assessment

## 2021-10-25 DIAGNOSIS — M1712 Unilateral primary osteoarthritis, left knee: Secondary | ICD-10-CM | POA: Diagnosis not present

## 2021-10-25 LAB — PROTIME-INR
INR: 1.1 (ref 0.8–1.2)
Prothrombin Time: 14 seconds (ref 11.4–15.2)

## 2021-10-25 LAB — BASIC METABOLIC PANEL
Anion gap: 8 (ref 5–15)
BUN: 11 mg/dL (ref 6–20)
CO2: 28 mmol/L (ref 22–32)
Calcium: 8.6 mg/dL — ABNORMAL LOW (ref 8.9–10.3)
Chloride: 102 mmol/L (ref 98–111)
Creatinine, Ser: 0.75 mg/dL (ref 0.44–1.00)
GFR, Estimated: >60 mL/min (ref >60)
Glucose, Bld: 135 mg/dL — ABNORMAL HIGH (ref 70–99)
Potassium: 3.7 mmol/L (ref 3.5–5.1)
Sodium: 138 mmol/L (ref 135–145)

## 2021-10-25 LAB — CBC
HCT: 31 % — ABNORMAL LOW (ref 36.0–46.0)
Hemoglobin: 9.3 g/dL — ABNORMAL LOW (ref 12.0–15.0)
MCH: 26.3 pg (ref 26.0–34.0)
MCHC: 30 g/dL (ref 30.0–36.0)
MCV: 87.8 fL (ref 80.0–100.0)
Platelets: 252 10*3/uL (ref 150–400)
RBC: 3.53 MIL/uL — ABNORMAL LOW (ref 3.87–5.11)
RDW: 16.2 % — ABNORMAL HIGH (ref 11.5–15.5)
WBC: 10.3 10*3/uL (ref 4.0–10.5)
nRBC: 0 % (ref 0.0–0.2)

## 2021-10-25 MED ORDER — WARFARIN SODIUM 5 MG PO TABS: 7.5000 mg | ORAL_TABLET | Freq: Once | ORAL | Status: DC

## 2021-10-25 NOTE — Progress Notes (Signed)
Physical Therapy Treatment Patient Details Name: Rachel Chen MRN: [id number] DOB: 18-Oct-1961 Today's Date: 10/25/2021   History of Present Illness Patient is 60 y.o. female s/p Lt TKA on 10/24/21 with PMH significant for OA, anxiety, depression, DVT, HTN, PVD.  Also hx of tobacco use.    PT Comments    Pt assisted with ambulating in hallway and requiring supplemental oxygen at this time.  Pt also performed LE exercises upon returning to room.  Pt encouraged to use incentive spirometer.      Recommendations for follow up therapy are one component of a multi-disciplinary discharge planning process, led by the attending physician.  Recommendations may be updated based on patient status, additional functional criteria and insurance authorization.  Follow Up Recommendations  Follow physician's recommendations for discharge plan and follow up therapies     Assistance Recommended at Discharge Frequent or constant Supervision/Assistance  Equipment Recommendations  None recommended by PT    Recommendations for Other Services       Precautions / Restrictions Precautions Precautions: Fall;Knee Precaution Comments: monitor sats Restrictions LLE Weight Bearing: Weight bearing as tolerated     Mobility  Bed Mobility Overal bed mobility: Needs Assistance Bed Mobility: Supine to Sit     Supine to sit: Supervision;HOB elevated          Transfers Overall transfer level: Needs assistance Equipment used: Rolling walker (2 wheels) Transfers: Sit to/from Stand Sit to Stand: Min guard           General transfer comment: verbal cues for UE and LE positioning; SPO2 86-89% on 1.5L O2 [place] with pt at rest    Ambulation/Gait Ambulation/Gait assistance: Min guard Gait Distance (Feet): 80 Feet Assistive device: Rolling walker (2 wheels) Gait Pattern/deviations: Step-to pattern;Decreased stance time - left;Antalgic Gait velocity: decr     General Gait Details: verbal cues for  sequence, RW position, step length, pt denies SOB, only endorses fatigue, remained on 2L O2 [place] for ambulation and SPO2 89% upon returning to room   Stairs             Wheelchair Mobility    Modified Rankin (Stroke Patients Only)       Balance                                            Cognition Arousal/Alertness: Awake/alert Behavior During Therapy: WFL for tasks assessed/performed Overall Cognitive Status: Within Functional Limits for tasks assessed                                          Exercises Total Joint Exercises Ankle Circles/Pumps: AROM;Both;10 reps Quad Sets: AROM;Both;10 reps Heel Slides: AAROM;Left;10 reps Hip ABduction/ADduction: AAROM;Left;10 reps Straight Leg Raises: AAROM;Left;10 reps    General Comments        Pertinent Vitals/Pain Pain Assessment: 0-10 Pain Score: 4  Pain Location: Lt knee Pain Descriptors / Indicators: Aching;Sore Pain Intervention(s): Repositioned;Monitored during session;Premedicated before session;Ice applied    Home Living                          Prior Function            PT Goals (current goals can now be found in the care plan section) Progress towards  PT goals: Progressing toward goals    Frequency    7X/week      PT Plan Current plan remains appropriate    Co-evaluation              AM-PAC PT "6 Clicks" Mobility   Outcome Measure  Help needed turning from your back to your side while in a flat bed without using bedrails?: A Little Help needed moving from lying on your back to sitting on the side of a flat bed without using bedrails?: A Little Help needed moving to and from a bed to a chair (including a wheelchair)?: A Little Help needed standing up from a chair using your arms (e.g., wheelchair or bedside chair)?: A Little Help needed to walk in hospital room?: A Little Help needed climbing 3-5 steps with a railing? : A Lot 6 Click Score:  17    End of Session Equipment Utilized During Treatment: Gait belt;Oxygen Activity Tolerance: Patient tolerated treatment well Patient left: in chair;with call bell/phone within reach;with chair alarm set Nurse Communication: Mobility status PT Visit Diagnosis: Muscle weakness (generalized) (M62.81);Difficulty in walking, not elsewhere classified (R26.2)     Time: 4132-4401 PT Time Calculation (min) (ACUTE ONLY): 30 min  Charges:  $Gait Training: 8-22 mins $Therapeutic Exercise: 8-22 mins                    Thomasene Mohair PT, DPT Acute Rehabilitation Services Pager: [phone] Office: [phone]   Janan Halter [place] 10/25/2021, 11:46 AM

## 2021-10-25 NOTE — TOC Transition Note (Signed)
Transition of Care [facility]) - CM/SW Discharge Note   Patient Details  Name: Rachel Chen MRN: [id number] Date of Birth: [birth year]  [facility]) CM/SW Contact:  Lennart Pall, LCSW Phone Number: 10/25/2021, 9:46 AM   Clinical Narrative:    Met with pt and confirming she has all needed DME at home.  Plan for OPPT at [facility] in [place].  No TOC needs.   Final next level of care: OP Rehab Barriers to Discharge: No Barriers Identified   Patient Goals and CMS Choice Patient states their goals for this hospitalization and ongoing recovery are:: return home      Discharge Placement                       Discharge Plan and Services                DME Arranged: N/A DME Agency: NA                  Social Determinants of Health (SDOH) Interventions     Readmission Risk Interventions No flowsheet data found.

## 2021-10-25 NOTE — Discharge Summary (Signed)
SPORTS MEDICINE & JOINT REPLACEMENT   Rachel Spurling, MD   Laurier Nancy, PA-C [address] [place], [place], [place]  [address]                             [phone]  PATIENT ID: Rachel Chen        MRN:  [id number]          DOB/AGE: 1961-11-20 / 60 y.o.    DISCHARGE SUMMARY  ADMISSION DATE:    10/24/2021 DISCHARGE DATE:   10/25/2021   ADMISSION DIAGNOSIS: S/P total knee replacement [Z96.659]    DISCHARGE DIAGNOSIS:  Osteoarthritis of left knee M17.12    ADDITIONAL DIAGNOSIS: Active Problems:   S/P total knee replacement  Past Medical History:  Diagnosis Date   Anxiety    Arthritis    Depression    DVT (deep venous thrombosis) (HCC)    H/O endarterectomy    aorta - 09/01/21   Heart murmur    Hypertension    Peripheral vascular disease (HCC)    aorta iliac disease   PONV (postoperative nausea and vomiting)     PROCEDURE: Procedure(s): TOTAL KNEE ARTHROPLASTY on 10/24/2021  CONSULTS:    HISTORY:  See H&P in chart  HOSPITAL COURSE:  Rachel Chen is a 60 y.o. admitted on 10/24/2021 and found to have a diagnosis of Osteoarthritis of left knee M17.12.  After appropriate laboratory studies were obtained  they were taken to the operating room on 10/24/2021 and underwent Procedure(s): TOTAL KNEE ARTHROPLASTY.   They were given perioperative antibiotics:  Anti-infectives (From admission, onward)    Start     Dose/Rate Route Frequency Ordered Stop   10/24/21 1400  ceFAZolin (ANCEF) IVPB 2g/100 mL premix        2 g 200 mL/hr over 30 Minutes Intravenous Every 6 hours 10/24/21 1032 10/24/21 2026   10/24/21 0600  ceFAZolin (ANCEF) IVPB 2g/100 mL premix        2 g 200 mL/hr over 30 Minutes Intravenous On call to O.R. 10/24/21 6073 10/24/21 0727     .  Patient given tranexamic acid IV or topical and exparel intra-operatively.  Tolerated the procedure well.    POD# 1: Vital signs were stable.  Patient denied Chest pain, shortness of breath, or calf pain.  Patient was  started on Aspirin twice daily at 8am.  Consults to PT, OT, and care management were made.  The patient was weight bearing as tolerated.  CPM was placed on the operative leg 0-90 degrees for 6-8 hours a day. When out of the CPM, patient was placed in the foam block to achieve full extension. Incentive spirometry was taught.  Dressing was changed.       POD #2, Continued  PT for ambulation and exercise program.  IV saline locked.  O2 discontinued.    The remainder of the hospital course was dedicated to ambulation and strengthening.   The patient was discharged on 1 Day Post-Op in  Good condition.  Blood products given:none  DIAGNOSTIC STUDIES: Recent vital signs: Patient Vitals for the past 24 hrs:  BP Temp Temp src Pulse Resp SpO2  10/25/21 0948 (!) 179/91 97.9 F (36.6 C) Oral 83 18 93 %  10/25/21 0448 (!) 141/74 97.9 F (36.6 C) Oral 66 18 96 %  10/25/21 0113 117/74 98.2 F (36.8 C) Oral 74 18 98 %  10/24/21 2125 132/70 97.8 F (36.6 C) Oral 72 18 97 %  10/24/21 1823 (!) 141/74 -- -- 80 16 92 %  10/24/21 1758 (!) 163/72 98.1 F (36.7 C) Oral 81 18 93 %  10/24/21 1333 (!) 145/80 98.2 F (36.8 C) -- 84 18 95 %       Recent laboratory studies: Recent Labs    10/25/21 0303  WBC 10.3  HGB 9.3*  HCT 31.0*  PLT 252   Recent Labs    10/25/21 0303  NA 138  K 3.7  CL 102  CO2 28  BUN 11  CREATININE 0.75  GLUCOSE 135*  CALCIUM 8.6*   Lab Results  Component Value Date   INR 1.1 10/25/2021   INR 1.0 10/24/2021     Recent Radiographic Studies :  No results found.  DISCHARGE INSTRUCTIONS:   DISCHARGE MEDICATIONS:      FOLLOW UP VISIT:    DISPOSITION: HOME VS. SNF  Dental Antibiotics:  In most cases prophylactic antibiotics for Dental procdeures after total joint surgery are not necessary.  Exceptions are as follows:  1. History of prior total joint infection  2. Severely immunocompromised (Organ Transplant, cancer chemotherapy, Rheumatoid  biologic meds such as Humera)  3. Poorly controlled diabetes (A1C &gt; 8.0, blood glucose over 200)  If you have one of these conditions, contact your surgeon for an antibiotic prescription, prior to your dental procedure.   CONDITION:  Good   Guy Sandifer 10/25/2021, 12:36 PM

## 2021-10-25 NOTE — Progress Notes (Signed)
ANTICOAGULATION CONSULT NOTE  Pharmacy Consult for warfarin Indication: Hx recurrent DVT  No Known Allergies  Patient Measurements: Height: 5' 4.96" (165 cm) Weight: 98 kg (216 lb 0.8 oz) IBW/kg (Calculated) : 56.91  Vital Signs: Temp: 98.2 F (36.8 C) (11/08 1259) Temp Source: Oral (11/08 1259) BP: 139/65 (11/08 1259) Pulse Rate: 75 (11/08 1259)  Labs: Recent Labs    10/24/21 0600 10/25/21 0303  HGB  --  9.3*  HCT  --  31.0*  PLT  --  252  LABPROT 13.1 14.0  INR 1.0 1.1  CREATININE  --  0.75     Estimated Creatinine Clearance: 87.6 mL/min (by C-G formula based on SCr of 0.75 mg/dL).  Medications:  Medications Prior to Admission  Medication Sig Dispense Refill Last Dose   ALPRAZolam (XANAX) 1 MG tablet Take 1 mg by mouth 3 (three) times daily as needed for anxiety.   10/23/2021   amitriptyline (ELAVIL) 25 MG tablet Take 25 mg by mouth at bedtime.   10/23/2021   amLODipine (NORVASC) 10 MG tablet Take 10 mg by mouth daily.    10/24/2021 at 0320   ARIPiprazole (ABILIFY) 2 MG tablet Take 2 mg by mouth daily.   10/24/2021 at 0320   carvedilol (COREG) 6.25 MG tablet Take 6.25 mg by mouth 2 (two) times daily.  6 10/24/2021 at 0320   cloNIDine (CATAPRES) 0.1 MG tablet Take 0.1 mg by mouth 2 (two) times daily.    10/24/2021 at 0320   furosemide (LASIX) 40 MG tablet Take 40 mg by mouth daily.   10/23/2021   gabapentin (NEURONTIN) 300 MG capsule Take 300 mg by mouth 3 (three) times daily.  2 10/24/2021 at 0320   lisinopril (PRINIVIL,ZESTRIL) 40 MG tablet Take 40 mg by mouth daily.    10/23/2021   Multiple Minerals-Vitamins (CAL MAG ZINC +D3) TABS Take 3 tablets by mouth daily.   10/23/2021   Multiple Vitamins-Minerals (PRESERVISION AREDS 2+MULTI VIT PO) Take 1 capsule by mouth daily.   10/23/2021   Potassium 99 MG TABS Take 99 mg by mouth daily.   10/23/2021   rosuvastatin (CRESTOR) 5 MG tablet Take 5 mg by mouth daily.   10/23/2021   triamterene-hydrochlorothiazide (MAXZIDE-25) 37.5-25 MG  tablet Take 0.5 tablets by mouth daily.   10/23/2021   venlafaxine XR (EFFEXOR-XR) 75 MG 24 hr capsule Take 225 mg by mouth daily with breakfast.   10/24/2021   warfarin (COUMADIN) 10 MG tablet Take 10 mg by mouth See admin instructions. Mon and Thurs   10/17/2021   warfarin (COUMADIN) 7.5 MG tablet Take 7.5 mg by mouth See admin instructions. Evern Bio, Wed, Fri and Sat   10/17/2021   Scheduled:   amitriptyline  25 mg Oral QHS   amLODipine  10 mg Oral Daily   ARIPiprazole  2 mg Oral Daily   carvedilol  6.25 mg Oral BID   cloNIDine  0.1 mg Oral BID   docusate sodium  100 mg Oral BID   ferrous sulfate  325 mg Oral TID PC   furosemide  40 mg Oral Daily   gabapentin  300 mg Oral TID   lisinopril  40 mg Oral Daily   pantoprazole  40 mg Oral Daily   rosuvastatin  5 mg Oral Daily   triamterene-hydrochlorothiazide  0.5 tablet Oral Daily   venlafaxine XR  225 mg Oral Q breakfast   Warfarin - Pharmacist Dosing Inpatient   Does not apply q1600   PRN: ALPRAZolam, alum & mag hydroxide-simeth, bisacodyl,  diphenhydrAMINE, HYDROmorphone (DILAUDID) injection, menthol-cetylpyridinium **OR** phenol, methocarbamol **OR** methocarbamol (ROBAXIN) IV, metoCLOPramide **OR** metoCLOPramide (REGLAN) injection, ondansetron **OR** ondansetron (ZOFRAN) IV, oxyCODONE, senna-docusate, sodium phosphate, zolpidem  Assessment: 45 yoF with PMH PVD s/p aorto-iliac endarterectomy in Sept, recurrent DVT taking warfarin PTA, admitted for left total knee arthroplasty.   Baseline INR non-therapeutic after holding warfarin x 1 wk Prior anticoagulation: warfarin 10 mg on Mon & Thurs, 7.5 mg all other days; last dose 10/31  Significant events:  Today, 10/25/2021: CBC: Hgb low as expected postop, only down ~1g from admission; Plt stable WNL INR remains subtherapeutic Major drug interactions: none currently No bleeding issues per nursing Eating 70-100% of meals  Goal of Therapy: INR 2-3  Plan: Warfarin 7.5 mg PO tonight  - for discharge, recommend continuing PTA regimen outlined above. No bridging per PCP (who manages warfarin in office) Daily INR CBC at least q72 hr while on warfarin Monitor for signs of bleeding or thrombosis    Bernadene Person, PharmD, BCPS [phone] 10/25/2021, 2:03 PM

## 2021-10-25 NOTE — Progress Notes (Signed)
SPORTS MEDICINE AND JOINT REPLACEMENT  Georgena Spurling, MD    Laurier Nancy, PA-C [address] [place], [place], [place]  [address]                             [phone]   PROGRESS NOTE  Subjective:  negative for Chest Pain  negative for Shortness of Breath  negative for Nausea/Vomiting   negative for Calf Pain  negative for Bowel Movement   Tolerating Diet: yes         Patient reports pain as 3 on 0-10 scale.    Objective: Vital signs in last 24 hours:   Patient Vitals for the past 24 hrs:  BP Temp Temp src Pulse Resp SpO2  10/25/21 0948 (!) 179/91 97.9 F (36.6 C) Oral 83 18 93 %  10/25/21 0448 (!) 141/74 97.9 F (36.6 C) Oral 66 18 96 %  10/25/21 0113 117/74 98.2 F (36.8 C) Oral 74 18 98 %  10/24/21 2125 132/70 97.8 F (36.6 C) Oral 72 18 97 %  10/24/21 1823 (!) 141/74 -- -- 80 16 92 %  10/24/21 1758 (!) 163/72 98.1 F (36.7 C) Oral 81 18 93 %  10/24/21 1333 (!) 145/80 98.2 F (36.8 C) -- 84 18 95 %    @flow {1959:LAST@   Intake/Output from previous day:   11/07 0701 - 11/08 0700 In: 3360.4 [P.O.:1535; I.V.:1525.4] Out: 5450 [Urine:5400]   Intake/Output this shift:   11/08 0701 - 11/08 1900 In: 240 [P.O.:240] Out: 250 [Urine:250]   Intake/Output      11/07 0701 11/08 0700 11/08 0701 11/09 0700   P.O. 1535 240   I.V. (mL/kg) 1525.4 (15.6)    IV Piggyback 300    Total Intake(mL/kg) 3360.4 (34.3) 240 (2.4)   Urine (mL/kg/hr) 5400 (2.3) 250 (0.5)   Blood 50    Total Output 5450 250   Net -2089.6 -10           LABORATORY DATA: Recent Labs    10/25/21 0303  WBC 10.3  HGB 9.3*  HCT 31.0*  PLT 252   Recent Labs    10/25/21 0303  NA 138  K 3.7  CL 102  CO2 28  BUN 11  CREATININE 0.75  GLUCOSE 135*  CALCIUM 8.6*   Lab Results  Component Value Date   INR 1.1 10/25/2021   INR 1.0 10/24/2021    Examination:  General appearance: alert, cooperative, and no distress Extremities: extremities normal, atraumatic, no cyanosis or  edema  Wound Exam: clean, dry, intact   Drainage:  None: wound tissue dry  Motor Exam: Quadriceps and Hamstrings Intact  Sensory Exam: Superficial Peroneal, Deep Peroneal, and Tibial normal   Assessment:    1 Day Post-Op  Procedure(s) (LRB): TOTAL KNEE ARTHROPLASTY (Left)  ADDITIONAL DIAGNOSIS:  Active Problems:   S/P total knee replacement     Plan: Physical Therapy as ordered Weight Bearing as Tolerated (WBAT)  DVT Prophylaxis:  Aspirin  DISCHARGE PLAN: Home  Patient doing well and ready for D/C home       Patient's anticipated LOS is less than 2 midnights, meeting these requirements: - Younger than 98 - Lives within 1 hour of care - Has a competent adult at home to recover with post-op recover - NO history of  - Chronic pain requiring opiods  - Diabetes  - Coronary Artery Disease  - Heart failure  - Heart attack  - Stroke  - DVT/VTE  -  Cardiac arrhythmia  - Respiratory Failure/COPD  - Renal failure  - Anemia  - Advanced Liver disease      Guy Sandifer 10/25/2021, 12:33 PM

## 2021-10-25 NOTE — Progress Notes (Signed)
Physical Therapy Treatment Patient Details Name: Rachel Chen MRN: [id number] DOB: Apr 21, 1961 Today's Date: 10/25/2021   History of Present Illness Patient is 60 y.o. female s/p Lt TKA on 10/24/21 with PMH significant for OA, anxiety, depression, DVT, HTN, PVD.  Also hx of tobacco use.    PT Comments    Pt ambulated in hallway and practiced safe stair technique.  Pt dizzy with performing stairs and SPO2 81% on room air upon returning to recliner and improved to 83% on room air prior to leaving room.  RN notified.  Pt able to ambulate and perform stairs without physical assist and also provided with HEP handout.  Pt feels ready for d/c home today.    Recommendations for follow up therapy are one component of a multi-disciplinary discharge planning process, led by the attending physician.  Recommendations may be updated based on patient status, additional functional criteria and insurance authorization.  Follow Up Recommendations  Follow physician's recommendations for discharge plan and follow up therapies     Assistance Recommended at Discharge Frequent or constant Supervision/Assistance  Equipment Recommendations  None recommended by PT    Recommendations for Other Services       Precautions / Restrictions Precautions Precautions: Fall;Knee Precaution Comments: monitor sats Restrictions Weight Bearing Restrictions: No LLE Weight Bearing: Weight bearing as tolerated     Mobility  Bed Mobility               General bed mobility comments: pt in recliner    Transfers Overall transfer level: Needs assistance Equipment used: Rolling walker (2 wheels) Transfers: Sit to/from Stand Sit to Stand: Min guard Stand pivot transfers: Min assist         General transfer comment: verbal cues for UE and LE positioning    Ambulation/Gait Ambulation/Gait assistance: Min guard Gait Distance (Feet): 60 Feet Assistive device: Rolling walker (2 wheels) Gait  Pattern/deviations: Step-to pattern;Decreased stance time - left;Antalgic Gait velocity: decr     General Gait Details: verbal cues for sequence, RW position, step length   Stairs Stairs: Yes Stairs assistance: Min guard Stair Management: Step to pattern;Forwards;Two rails Number of Stairs: 3 General stair comments: pt reports she can reach both rails at home; verbal cues for safety and sequence; pt reports dizziness after ascending and performed brief standing rest break prior to descending; SPO2 81% on room air upon return to recliner   Wheelchair Mobility    Modified Rankin (Stroke Patients Only)       Balance                                            Cognition Arousal/Alertness: Awake/alert Behavior During Therapy: WFL for tasks assessed/performed Overall Cognitive Status: Within Functional Limits for tasks assessed                                          Exercises      General Comments        Pertinent Vitals/Pain Pain Assessment: 0-10 Pain Score: 4  Pain Location: Lt knee Pain Descriptors / Indicators: Aching;Sore Pain Intervention(s): Repositioned;Monitored during session    Home Living                          Prior  Function            PT Goals (current goals can now be found in the care plan section) Progress towards PT goals: Progressing toward goals    Frequency    7X/week      PT Plan Current plan remains appropriate    Co-evaluation              AM-PAC PT "6 Clicks" Mobility   Outcome Measure  Help needed turning from your back to your side while in a flat bed without using bedrails?: A Little Help needed moving from lying on your back to sitting on the side of a flat bed without using bedrails?: A Little Help needed moving to and from a bed to a chair (including a wheelchair)?: A Little Help needed standing up from a chair using your arms (e.g., wheelchair or bedside chair)?:  A Little Help needed to walk in hospital room?: A Little Help needed climbing 3-5 steps with a railing? : A Little 6 Click Score: 18    End of Session Equipment Utilized During Treatment: Gait belt Activity Tolerance: Patient tolerated treatment well Patient left: in chair;with call bell/phone within reach;with chair alarm set Nurse Communication: Mobility status PT Visit Diagnosis: Muscle weakness (generalized) (M62.81);Difficulty in walking, not elsewhere classified (R26.2)     Time: 1962-2297 PT Time Calculation (min) (ACUTE ONLY): 12 min  Charges:  $Gait Training: 8-22 mins                    Thomasene Mohair PT, DPT Acute Rehabilitation Services Pager: [phone] Office: [phone]    Janan Halter [place] 10/25/2021, 4:48 PM

## 2021-10-29 ENCOUNTER — Encounter (HOSPITAL_COMMUNITY): Payer: Self-pay | Admitting: Orthopedic Surgery

## 2021-11-03 DIAGNOSIS — Z96652 Presence of left artificial knee joint: Secondary | ICD-10-CM | POA: Insufficient documentation

## 2022-10-18 DIAGNOSIS — Z7901 Long term (current) use of anticoagulants: Secondary | ICD-10-CM | POA: Diagnosis not present

## 2022-10-27 DIAGNOSIS — Z7901 Long term (current) use of anticoagulants: Secondary | ICD-10-CM | POA: Diagnosis not present

## 2022-11-03 DIAGNOSIS — Z7901 Long term (current) use of anticoagulants: Secondary | ICD-10-CM | POA: Diagnosis not present

## 2022-11-07 DIAGNOSIS — R69 Illness, unspecified: Secondary | ICD-10-CM | POA: Diagnosis not present

## 2022-11-07 DIAGNOSIS — G8929 Other chronic pain: Secondary | ICD-10-CM | POA: Diagnosis not present

## 2022-11-07 DIAGNOSIS — Z86718 Personal history of other venous thrombosis and embolism: Secondary | ICD-10-CM | POA: Diagnosis not present

## 2022-11-07 DIAGNOSIS — M549 Dorsalgia, unspecified: Secondary | ICD-10-CM | POA: Diagnosis not present

## 2022-11-07 DIAGNOSIS — E785 Hyperlipidemia, unspecified: Secondary | ICD-10-CM | POA: Diagnosis not present

## 2022-11-07 DIAGNOSIS — Z7901 Long term (current) use of anticoagulants: Secondary | ICD-10-CM | POA: Diagnosis not present

## 2022-11-07 DIAGNOSIS — Z6837 Body mass index (BMI) 37.0-37.9, adult: Secondary | ICD-10-CM | POA: Diagnosis not present

## 2022-11-07 DIAGNOSIS — I10 Essential (primary) hypertension: Secondary | ICD-10-CM | POA: Diagnosis not present

## 2022-11-15 DIAGNOSIS — K449 Diaphragmatic hernia without obstruction or gangrene: Secondary | ICD-10-CM | POA: Diagnosis not present

## 2022-11-15 DIAGNOSIS — K5730 Diverticulosis of large intestine without perforation or abscess without bleeding: Secondary | ICD-10-CM | POA: Diagnosis not present

## 2022-11-15 DIAGNOSIS — R109 Unspecified abdominal pain: Secondary | ICD-10-CM | POA: Diagnosis not present

## 2022-11-27 DIAGNOSIS — Z7901 Long term (current) use of anticoagulants: Secondary | ICD-10-CM | POA: Diagnosis not present

## 2022-11-29 DIAGNOSIS — Z01818 Encounter for other preprocedural examination: Secondary | ICD-10-CM | POA: Diagnosis not present

## 2022-11-29 DIAGNOSIS — M549 Dorsalgia, unspecified: Secondary | ICD-10-CM | POA: Diagnosis not present

## 2022-11-29 DIAGNOSIS — E785 Hyperlipidemia, unspecified: Secondary | ICD-10-CM | POA: Diagnosis not present

## 2022-11-29 DIAGNOSIS — Z86718 Personal history of other venous thrombosis and embolism: Secondary | ICD-10-CM | POA: Diagnosis not present

## 2022-11-29 DIAGNOSIS — E669 Obesity, unspecified: Secondary | ICD-10-CM | POA: Diagnosis not present

## 2022-11-29 DIAGNOSIS — G8929 Other chronic pain: Secondary | ICD-10-CM | POA: Diagnosis not present

## 2022-11-29 DIAGNOSIS — G4700 Insomnia, unspecified: Secondary | ICD-10-CM | POA: Diagnosis not present

## 2022-11-29 DIAGNOSIS — Z7901 Long term (current) use of anticoagulants: Secondary | ICD-10-CM | POA: Diagnosis not present

## 2022-11-29 DIAGNOSIS — I10 Essential (primary) hypertension: Secondary | ICD-10-CM | POA: Diagnosis not present

## 2022-11-29 DIAGNOSIS — R69 Illness, unspecified: Secondary | ICD-10-CM | POA: Diagnosis not present

## 2022-11-29 DIAGNOSIS — K469 Unspecified abdominal hernia without obstruction or gangrene: Secondary | ICD-10-CM | POA: Diagnosis not present

## 2022-12-04 DIAGNOSIS — K432 Incisional hernia without obstruction or gangrene: Secondary | ICD-10-CM | POA: Diagnosis not present

## 2022-12-04 DIAGNOSIS — K430 Incisional hernia with obstruction, without gangrene: Secondary | ICD-10-CM | POA: Diagnosis not present

## 2022-12-04 DIAGNOSIS — R69 Illness, unspecified: Secondary | ICD-10-CM | POA: Diagnosis not present

## 2022-12-05 DIAGNOSIS — K449 Diaphragmatic hernia without obstruction or gangrene: Secondary | ICD-10-CM | POA: Diagnosis not present

## 2022-12-05 DIAGNOSIS — J9602 Acute respiratory failure with hypercapnia: Secondary | ICD-10-CM | POA: Diagnosis not present

## 2022-12-05 DIAGNOSIS — J9601 Acute respiratory failure with hypoxia: Secondary | ICD-10-CM | POA: Diagnosis not present

## 2022-12-05 DIAGNOSIS — K432 Incisional hernia without obstruction or gangrene: Secondary | ICD-10-CM | POA: Diagnosis not present

## 2022-12-05 DIAGNOSIS — R0902 Hypoxemia: Secondary | ICD-10-CM | POA: Diagnosis not present

## 2022-12-12 DIAGNOSIS — Z7901 Long term (current) use of anticoagulants: Secondary | ICD-10-CM | POA: Diagnosis not present

## 2022-12-14 DIAGNOSIS — Z7901 Long term (current) use of anticoagulants: Secondary | ICD-10-CM | POA: Diagnosis not present

## 2022-12-14 DIAGNOSIS — Z8249 Family history of ischemic heart disease and other diseases of the circulatory system: Secondary | ICD-10-CM | POA: Diagnosis not present

## 2022-12-14 DIAGNOSIS — G4700 Insomnia, unspecified: Secondary | ICD-10-CM | POA: Diagnosis not present

## 2022-12-14 DIAGNOSIS — I10 Essential (primary) hypertension: Secondary | ICD-10-CM | POA: Diagnosis not present

## 2022-12-14 DIAGNOSIS — Z6838 Body mass index (BMI) 38.0-38.9, adult: Secondary | ICD-10-CM | POA: Diagnosis not present

## 2022-12-14 DIAGNOSIS — Z87891 Personal history of nicotine dependence: Secondary | ICD-10-CM | POA: Diagnosis not present

## 2022-12-14 DIAGNOSIS — I82509 Chronic embolism and thrombosis of unspecified deep veins of unspecified lower extremity: Secondary | ICD-10-CM | POA: Diagnosis not present

## 2022-12-14 DIAGNOSIS — F4323 Adjustment disorder with mixed anxiety and depressed mood: Secondary | ICD-10-CM | POA: Diagnosis not present

## 2022-12-14 DIAGNOSIS — R69 Illness, unspecified: Secondary | ICD-10-CM | POA: Diagnosis not present

## 2022-12-14 DIAGNOSIS — E785 Hyperlipidemia, unspecified: Secondary | ICD-10-CM | POA: Diagnosis not present

## 2022-12-19 ENCOUNTER — Encounter (INDEPENDENT_AMBULATORY_CARE_PROVIDER_SITE_OTHER): Payer: Self-pay | Admitting: Vascular Surgery

## 2022-12-19 ENCOUNTER — Ambulatory Visit (INDEPENDENT_AMBULATORY_CARE_PROVIDER_SITE_OTHER): Payer: 59 | Admitting: Vascular Surgery

## 2022-12-19 VITALS — BP 151/83 | HR 72 | Resp 17 | Ht 64.0 in | Wt 218.0 lb

## 2022-12-19 DIAGNOSIS — Z720 Tobacco use: Secondary | ICD-10-CM | POA: Diagnosis not present

## 2022-12-19 DIAGNOSIS — I82532 Chronic embolism and thrombosis of left popliteal vein: Secondary | ICD-10-CM

## 2022-12-19 DIAGNOSIS — I10 Essential (primary) hypertension: Secondary | ICD-10-CM | POA: Diagnosis not present

## 2022-12-19 DIAGNOSIS — I7409 Other arterial embolism and thrombosis of abdominal aorta: Secondary | ICD-10-CM | POA: Diagnosis not present

## 2022-12-19 NOTE — Assessment & Plan Note (Signed)
blood pressure control important in reducing the progression of atherosclerotic disease. On appropriate oral medications.  

## 2022-12-19 NOTE — Progress Notes (Signed)
Patient ID: Rachel Chen, female   DOB: 1961-01-27, 62 y.o.   MRN: [id number]  Chief Complaint  Patient presents with   New Patient (Initial Visit)    Consult for abdominal aortic aneurysm    HPI Rachel Chen is a 62 y.o. female.  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  She has previously had an aortoiliac endarterectomy for a failed right iliac stent and so this is likely some enlargement from the patch angioplasty and not really a true aneurysm.  She recently underwent a abdominal wall hernia repair and seems to be slowly recovering from that.  She does not have similar claudication symptoms as she did initially.  She was originally treated with a stent by a physician in [place] for having the aortoiliac endarterectomy at [facility] and [place].  She denies any ischemic rest pain or ulceration.  Not really having claudication but more limited from her recent hernia surgery than anything.     Past Medical History:  Diagnosis Date   Anxiety    Arthritis    Depression    DVT (deep venous thrombosis) (HCC)    H/O endarterectomy    aorta - 09/01/21   Heart murmur    Hypertension    Peripheral vascular disease (HCC)    aorta iliac disease   PONV (postoperative nausea and vomiting)     Past Surgical History:  Procedure Laterality Date   " clean out of abdominal aorta "      ABDOMINAL HYSTERECTOMY     BACK SURGERY     LOWER EXTREMITY ANGIOGRAPHY N/A 08/26/2018   Procedure: LOWER EXTREMITY ANGIOGRAPHY;  Surgeon: Waynetta Sandy, MD;  Location: [facility];  Service: Cardiovascular;  Laterality: N/A;   PERIPHERAL VASCULAR INTERVENTION Right 08/26/2018   Procedure: PERIPHERAL VASCULAR INTERVENTION;  Surgeon: Waynetta Sandy, MD;  Location: [facility];  Service:  Cardiovascular;  Laterality: Right;  Common iliac   right knee arthroscopy      torn hamstrin surgery on right leg      TOTAL KNEE ARTHROPLASTY Left 10/24/2021   Procedure: TOTAL KNEE ARTHROPLASTY;  Surgeon: Vickey Huger, MD;  Location: [facility];  Service: Orthopedics;  Laterality: Left;     Family History No bleeding disorders, clotting disorders, autoimmune diseases, or aneurysms   Social History   Tobacco Use   Smoking status: Every Day    Packs/day: 1.00    Types: Cigarettes    Last attempt to quit: 12/11/2019    Years since quitting: 3.0   Smokeless tobacco: Never  Vaping Use   Vaping Use: Former  Substance Use Topics   Alcohol use: Yes    Comment: occassional   Drug use: Never   Patient still smoking some and trying to quit  No Known Allergies  Current Outpatient Medications  Medication Sig Dispense Refill   ALPRAZolam (XANAX) 1 MG tablet Take 1 mg by mouth 3 (three) times daily as needed for anxiety.     amitriptyline (ELAVIL) 25 MG tablet Take 25 mg by mouth at bedtime.     amLODipine (NORVASC) 10 MG tablet Take 10 mg by mouth daily.      ARIPiprazole (ABILIFY) 2 MG tablet Take 2 mg by mouth daily.     carvedilol (COREG) 6.25 MG tablet Take 6.25 mg  by mouth 2 (two) times daily.  6   cloNIDine (CATAPRES) 0.1 MG tablet Take 0.1 mg by mouth 2 (two) times daily.      furosemide (LASIX) 40 MG tablet Take 40 mg by mouth daily.     gabapentin (NEURONTIN) 300 MG capsule Take 300 mg by mouth 3 (three) times daily.  2   lisinopril (PRINIVIL,ZESTRIL) 40 MG tablet Take 40 mg by mouth daily.      Multiple Minerals-Vitamins (CAL MAG ZINC +D3) TABS Take 3 tablets by mouth daily.     Multiple Vitamins-Minerals (PRESERVISION AREDS 2+MULTI VIT PO) Take 1 capsule by mouth daily.     Potassium 99 MG TABS Take 99 mg by mouth daily.     rosuvastatin (CRESTOR) 5 MG tablet Take 5 mg by mouth daily.     venlafaxine XR (EFFEXOR-XR) 75 MG 24 hr capsule Take 225 mg by mouth daily with  breakfast.     warfarin (COUMADIN) 10 MG tablet Take 6 mg by mouth See admin instructions. Mon and Thurs     warfarin (COUMADIN) 7.5 MG tablet Take 7.5 mg by mouth See admin instructions. Sun, Tues, Wed, Fri and Sat     triamterene-hydrochlorothiazide (MAXZIDE-25) 37.5-25 MG tablet Take 0.5 tablets by mouth daily. (Patient not taking: Reported on 12/19/2022)     No current facility-administered medications for this visit.      REVIEW OF SYSTEMS (Negative unless checked)  Constitutional: [] Weight loss  [] Fever  [] Chills Cardiac: [] Chest pain   [] Chest pressure   [] Palpitations   [] Shortness of breath when laying flat   [] Shortness of breath at rest   [] Shortness of breath with exertion. Vascular:  [x] Pain in legs with walking   [] Pain in legs at rest   [] Pain in legs when laying flat   [] Claudication   [] Pain in feet when walking  [] Pain in feet at rest  [] Pain in feet when laying flat   [] History of DVT   [] Phlebitis   [] Swelling in legs   [] Varicose veins   [] Non-healing ulcers Pulmonary:   [] Uses home oxygen   [] Productive cough   [] Hemoptysis   [] Wheeze  [] COPD   [] Asthma Neurologic:  [] Dizziness  [] Blackouts   [] Seizures   [] History of stroke   [] History of TIA  [] Aphasia   [] Temporary blindness   [] Dysphagia   [] Weakness or numbness in arms   [] Weakness or numbness in legs Musculoskeletal:  [] Arthritis   [] Joint swelling   [] Joint pain   [] Low back pain Hematologic:  [] Easy bruising  [] Easy bleeding   [] Hypercoagulable state   [] Anemic  [] Hepatitis Gastrointestinal:  [] Blood in stool   [] Vomiting blood  [] Gastroesophageal reflux/heartburn   [x] Abdominal pain Genitourinary:  [] Chronic kidney disease   [] Difficult urination  [] Frequent urination  [] Burning with urination   [] Hematuria Skin:  [] Rashes   [] Ulcers   [] Wounds Psychological:  [] History of anxiety   []  History of major depression.    Physical Exam BP (!) 151/83 (BP Location: Left Arm)   Pulse 72   Resp 17   Ht 5\' 4"  (1.626  m)   Wt 218 lb (98.9 kg)   BMI 37.42 kg/m  Gen:  WD/WN, NAD Head: [place]/AT, No temporalis wasting Ear/Nose/Throat: Hearing grossly intact, nares w/o erythema or drainage, oropharynx w/o Erythema/Exudate Eyes: Conjunctiva clear, sclera non-icteric  Neck: trachea midline.  No JVD.  Pulmonary:  Good air movement, respirations not labored, no use of accessory muscles  Cardiac: RRR, no JVD Vascular:  Vessel Right Left  Radial  Palpable Palpable                          DP Palpable Palpable  PT Palpable Palpable   Gastrointestinal:. No masses, surgical incisions, or scars. Musculoskeletal: M/S 5/5 throughout.  Extremities without ischemic changes.  No deformity or atrophy. Mild LLE edema. Neurologic: Sensation grossly intact in extremities.  Symmetrical.  Speech is fluent. Motor exam as listed above. Psychiatric: Judgment intact, Mood & affect appropriate for pt's clinical situation. Dermatologic: No rashes or ulcers noted.  No cellulitis or open wounds.    Radiology No results found.  Labs No results found for this or any previous visit (from the past 2160 hour(s)).  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  He recently performed CT scan that suggested an aneurysm of the right common iliac artery but this is more likely enlargement after patch angioplasty than a true aneurysm.  This is not an overly concerning finding.  She has good perfusion at this point.  This is something we will need to be followed and monitored and I have recommended an aortoiliac study with some ABIs in a few months.  I have discussed the importance of risk factor modification including smoking cessation which she says she is working on.  She is already on anticoagulation for previous DVT and that is certainly adequate for her peripheral  arterial disease.  Chronic deep vein thrombosis (DVT) of popliteal vein of left lower extremity (HCC) On anticoagulation.  Does have some postphlebitic symptoms.  Compression socks for swelling.  Primary hypertension blood pressure control important in reducing the progression of atherosclerotic disease. On appropriate oral medications.   Tobacco use We had a discussion for approximately 3-4 minutes regarding the absolute need for smoking cessation due to the deleterious nature of tobacco on the vascular system. We discussed the tobacco use would diminish patency of any intervention, and likely significantly worsen progressio of disease. We discussed multiple agents for quitting including replacement therapy or medications to reduce cravings such as Chantix. The patient voices their understanding of the importance of smoking cessation.      Leotis Pain 12/19/2022, 3:38 PM   This note was created with Dragon medical transcription system.  Any errors from dictation are unintentional.

## 2022-12-19 NOTE — Assessment & Plan Note (Signed)
Patient has previously had a right iliac stent with subsequent occlusion followed by an aortoiliac endarterectomy performed at [facility] in 2022.  He recently performed CT scan that suggested an aneurysm of the right common iliac artery but this is more likely enlargement after patch angioplasty than a true aneurysm.  This is not an overly concerning finding.  She has good perfusion at this point.  This is something we will need to be followed and monitored and I have recommended an aortoiliac study with some ABIs in a few months.  I have discussed the importance of risk factor modification including smoking cessation which she says she is working on.  She is already on anticoagulation for previous DVT and that is certainly adequate for her peripheral arterial disease.

## 2022-12-19 NOTE — Assessment & Plan Note (Signed)
We had a discussion for approximately 3-4 minutes regarding the absolute need for smoking cessation due to the deleterious nature of tobacco on the vascular system. We discussed the tobacco use would diminish patency of any intervention, and likely significantly worsen progressio of disease. We discussed multiple agents for quitting including replacement therapy or medications to reduce cravings such as Chantix. The patient voices their understanding of the importance of smoking cessation.  

## 2022-12-19 NOTE — Assessment & Plan Note (Signed)
On anticoagulation.  Does have some postphlebitic symptoms.  Compression socks for swelling.

## 2022-12-22 DIAGNOSIS — Z86718 Personal history of other venous thrombosis and embolism: Secondary | ICD-10-CM | POA: Diagnosis not present

## 2022-12-22 DIAGNOSIS — Z7901 Long term (current) use of anticoagulants: Secondary | ICD-10-CM | POA: Diagnosis not present

## 2022-12-29 DIAGNOSIS — Z7901 Long term (current) use of anticoagulants: Secondary | ICD-10-CM | POA: Diagnosis not present

## 2023-01-05 DIAGNOSIS — Z7901 Long term (current) use of anticoagulants: Secondary | ICD-10-CM | POA: Diagnosis not present

## 2023-01-12 DIAGNOSIS — R791 Abnormal coagulation profile: Secondary | ICD-10-CM | POA: Diagnosis not present

## 2023-01-19 DIAGNOSIS — Z7901 Long term (current) use of anticoagulants: Secondary | ICD-10-CM | POA: Diagnosis not present

## 2023-01-26 DIAGNOSIS — Z7901 Long term (current) use of anticoagulants: Secondary | ICD-10-CM | POA: Diagnosis not present

## 2023-02-02 DIAGNOSIS — Z7901 Long term (current) use of anticoagulants: Secondary | ICD-10-CM | POA: Diagnosis not present

## 2023-02-09 DIAGNOSIS — Z7901 Long term (current) use of anticoagulants: Secondary | ICD-10-CM | POA: Diagnosis not present

## 2023-02-12 DIAGNOSIS — Z6836 Body mass index (BMI) 36.0-36.9, adult: Secondary | ICD-10-CM | POA: Diagnosis not present

## 2023-02-12 DIAGNOSIS — R69 Illness, unspecified: Secondary | ICD-10-CM | POA: Diagnosis not present

## 2023-02-12 DIAGNOSIS — R5383 Other fatigue: Secondary | ICD-10-CM | POA: Diagnosis not present

## 2023-02-16 DIAGNOSIS — Z7901 Long term (current) use of anticoagulants: Secondary | ICD-10-CM | POA: Diagnosis not present

## 2023-02-28 DIAGNOSIS — R519 Headache, unspecified: Secondary | ICD-10-CM | POA: Diagnosis not present

## 2023-02-28 DIAGNOSIS — M791 Myalgia, unspecified site: Secondary | ICD-10-CM | POA: Diagnosis not present

## 2023-02-28 DIAGNOSIS — J324 Chronic pansinusitis: Secondary | ICD-10-CM | POA: Diagnosis not present

## 2023-03-05 DIAGNOSIS — Z7901 Long term (current) use of anticoagulants: Secondary | ICD-10-CM | POA: Diagnosis not present

## 2023-03-07 DIAGNOSIS — Z9889 Other specified postprocedural states: Secondary | ICD-10-CM | POA: Diagnosis not present

## 2023-03-07 DIAGNOSIS — Z8249 Family history of ischemic heart disease and other diseases of the circulatory system: Secondary | ICD-10-CM | POA: Diagnosis not present

## 2023-03-07 DIAGNOSIS — F419 Anxiety disorder, unspecified: Secondary | ICD-10-CM | POA: Diagnosis not present

## 2023-03-07 DIAGNOSIS — I2102 ST elevation (STEMI) myocardial infarction involving left anterior descending coronary artery: Secondary | ICD-10-CM | POA: Diagnosis not present

## 2023-03-07 DIAGNOSIS — Z7901 Long term (current) use of anticoagulants: Secondary | ICD-10-CM | POA: Diagnosis not present

## 2023-03-07 DIAGNOSIS — Z9851 Tubal ligation status: Secondary | ICD-10-CM | POA: Diagnosis not present

## 2023-03-07 DIAGNOSIS — Z87891 Personal history of nicotine dependence: Secondary | ICD-10-CM | POA: Diagnosis not present

## 2023-03-07 DIAGNOSIS — R9431 Abnormal electrocardiogram [ECG] [EKG]: Secondary | ICD-10-CM | POA: Diagnosis not present

## 2023-03-07 DIAGNOSIS — J810 Acute pulmonary edema: Secondary | ICD-10-CM | POA: Diagnosis not present

## 2023-03-07 DIAGNOSIS — R079 Chest pain, unspecified: Secondary | ICD-10-CM | POA: Diagnosis not present

## 2023-03-07 DIAGNOSIS — I2109 ST elevation (STEMI) myocardial infarction involving other coronary artery of anterior wall: Secondary | ICD-10-CM | POA: Diagnosis not present

## 2023-03-07 DIAGNOSIS — Z955 Presence of coronary angioplasty implant and graft: Secondary | ICD-10-CM | POA: Diagnosis not present

## 2023-03-07 DIAGNOSIS — I5021 Acute systolic (congestive) heart failure: Secondary | ICD-10-CM | POA: Diagnosis not present

## 2023-03-07 DIAGNOSIS — I2510 Atherosclerotic heart disease of native coronary artery without angina pectoris: Secondary | ICD-10-CM | POA: Diagnosis not present

## 2023-03-07 DIAGNOSIS — Z90721 Acquired absence of ovaries, unilateral: Secondary | ICD-10-CM | POA: Diagnosis not present

## 2023-03-07 DIAGNOSIS — J189 Pneumonia, unspecified organism: Secondary | ICD-10-CM | POA: Diagnosis not present

## 2023-03-07 DIAGNOSIS — J811 Chronic pulmonary edema: Secondary | ICD-10-CM | POA: Diagnosis not present

## 2023-03-07 DIAGNOSIS — I130 Hypertensive heart and chronic kidney disease with heart failure and stage 1 through stage 4 chronic kidney disease, or unspecified chronic kidney disease: Secondary | ICD-10-CM | POA: Diagnosis not present

## 2023-03-07 DIAGNOSIS — E559 Vitamin D deficiency, unspecified: Secondary | ICD-10-CM | POA: Diagnosis not present

## 2023-03-07 DIAGNOSIS — Z86718 Personal history of other venous thrombosis and embolism: Secondary | ICD-10-CM | POA: Diagnosis not present

## 2023-03-07 DIAGNOSIS — N189 Chronic kidney disease, unspecified: Secondary | ICD-10-CM | POA: Diagnosis not present

## 2023-03-07 DIAGNOSIS — I723 Aneurysm of iliac artery: Secondary | ICD-10-CM | POA: Diagnosis not present

## 2023-03-07 DIAGNOSIS — R0602 Shortness of breath: Secondary | ICD-10-CM | POA: Diagnosis not present

## 2023-03-07 DIAGNOSIS — J9601 Acute respiratory failure with hypoxia: Secondary | ICD-10-CM | POA: Diagnosis not present

## 2023-03-07 DIAGNOSIS — Z86010 Personal history of colonic polyps: Secondary | ICD-10-CM | POA: Diagnosis not present

## 2023-03-07 DIAGNOSIS — I82509 Chronic embolism and thrombosis of unspecified deep veins of unspecified lower extremity: Secondary | ICD-10-CM | POA: Diagnosis not present

## 2023-03-07 DIAGNOSIS — F17200 Nicotine dependence, unspecified, uncomplicated: Secondary | ICD-10-CM | POA: Diagnosis not present

## 2023-03-07 DIAGNOSIS — I5189 Other ill-defined heart diseases: Secondary | ICD-10-CM | POA: Diagnosis not present

## 2023-03-07 DIAGNOSIS — I519 Heart disease, unspecified: Secondary | ICD-10-CM | POA: Diagnosis not present

## 2023-03-07 DIAGNOSIS — Z8042 Family history of malignant neoplasm of prostate: Secondary | ICD-10-CM | POA: Diagnosis not present

## 2023-03-07 DIAGNOSIS — E876 Hypokalemia: Secondary | ICD-10-CM | POA: Diagnosis not present

## 2023-03-13 DIAGNOSIS — Z7901 Long term (current) use of anticoagulants: Secondary | ICD-10-CM | POA: Diagnosis not present

## 2023-03-13 DIAGNOSIS — Z6835 Body mass index (BMI) 35.0-35.9, adult: Secondary | ICD-10-CM | POA: Diagnosis not present

## 2023-03-13 DIAGNOSIS — I213 ST elevation (STEMI) myocardial infarction of unspecified site: Secondary | ICD-10-CM | POA: Diagnosis not present

## 2023-03-20 DIAGNOSIS — Z7901 Long term (current) use of anticoagulants: Secondary | ICD-10-CM | POA: Diagnosis not present

## 2023-03-22 DIAGNOSIS — I2510 Atherosclerotic heart disease of native coronary artery without angina pectoris: Secondary | ICD-10-CM | POA: Diagnosis not present

## 2023-03-22 DIAGNOSIS — I255 Ischemic cardiomyopathy: Secondary | ICD-10-CM | POA: Diagnosis not present

## 2023-03-22 DIAGNOSIS — Z87891 Personal history of nicotine dependence: Secondary | ICD-10-CM | POA: Diagnosis not present

## 2023-03-22 DIAGNOSIS — Z7901 Long term (current) use of anticoagulants: Secondary | ICD-10-CM | POA: Diagnosis not present

## 2023-03-22 DIAGNOSIS — E785 Hyperlipidemia, unspecified: Secondary | ICD-10-CM | POA: Diagnosis not present

## 2023-03-22 DIAGNOSIS — I10 Essential (primary) hypertension: Secondary | ICD-10-CM | POA: Diagnosis not present

## 2023-03-22 DIAGNOSIS — I82532 Chronic embolism and thrombosis of left popliteal vein: Secondary | ICD-10-CM | POA: Diagnosis not present

## 2023-03-22 DIAGNOSIS — Z955 Presence of coronary angioplasty implant and graft: Secondary | ICD-10-CM | POA: Diagnosis not present

## 2023-03-22 DIAGNOSIS — Z7982 Long term (current) use of aspirin: Secondary | ICD-10-CM | POA: Diagnosis not present

## 2023-03-22 DIAGNOSIS — I252 Old myocardial infarction: Secondary | ICD-10-CM | POA: Diagnosis not present

## 2023-03-27 DIAGNOSIS — Z7901 Long term (current) use of anticoagulants: Secondary | ICD-10-CM | POA: Diagnosis not present

## 2023-04-11 DIAGNOSIS — Z7901 Long term (current) use of anticoagulants: Secondary | ICD-10-CM | POA: Diagnosis not present

## 2023-04-24 DIAGNOSIS — Z6835 Body mass index (BMI) 35.0-35.9, adult: Secondary | ICD-10-CM | POA: Diagnosis not present

## 2023-04-24 DIAGNOSIS — M25511 Pain in right shoulder: Secondary | ICD-10-CM | POA: Diagnosis not present

## 2023-04-24 DIAGNOSIS — N950 Postmenopausal bleeding: Secondary | ICD-10-CM | POA: Diagnosis not present

## 2023-05-01 DIAGNOSIS — M25511 Pain in right shoulder: Secondary | ICD-10-CM | POA: Diagnosis not present

## 2023-05-02 DIAGNOSIS — Z7901 Long term (current) use of anticoagulants: Secondary | ICD-10-CM | POA: Diagnosis not present

## 2023-05-09 DIAGNOSIS — Z7901 Long term (current) use of anticoagulants: Secondary | ICD-10-CM | POA: Diagnosis not present

## 2023-05-16 DIAGNOSIS — Z7901 Long term (current) use of anticoagulants: Secondary | ICD-10-CM | POA: Diagnosis not present

## 2023-05-30 DIAGNOSIS — Z124 Encounter for screening for malignant neoplasm of cervix: Secondary | ICD-10-CM | POA: Diagnosis not present

## 2023-05-30 DIAGNOSIS — N889 Noninflammatory disorder of cervix uteri, unspecified: Secondary | ICD-10-CM | POA: Diagnosis not present

## 2023-05-30 DIAGNOSIS — N950 Postmenopausal bleeding: Secondary | ICD-10-CM | POA: Diagnosis not present

## 2023-05-31 DIAGNOSIS — Z7901 Long term (current) use of anticoagulants: Secondary | ICD-10-CM | POA: Diagnosis not present

## 2023-05-31 DIAGNOSIS — N950 Postmenopausal bleeding: Secondary | ICD-10-CM | POA: Diagnosis not present

## 2023-06-04 DIAGNOSIS — Z5321 Procedure and treatment not carried out due to patient leaving prior to being seen by health care provider: Secondary | ICD-10-CM | POA: Diagnosis not present

## 2023-06-04 DIAGNOSIS — R457 State of emotional shock and stress, unspecified: Secondary | ICD-10-CM | POA: Diagnosis not present

## 2023-06-04 DIAGNOSIS — I10 Essential (primary) hypertension: Secondary | ICD-10-CM | POA: Diagnosis not present

## 2023-06-04 DIAGNOSIS — Z955 Presence of coronary angioplasty implant and graft: Secondary | ICD-10-CM | POA: Diagnosis not present

## 2023-06-04 DIAGNOSIS — I252 Old myocardial infarction: Secondary | ICD-10-CM | POA: Diagnosis not present

## 2023-06-04 DIAGNOSIS — K449 Diaphragmatic hernia without obstruction or gangrene: Secondary | ICD-10-CM | POA: Diagnosis not present

## 2023-06-04 DIAGNOSIS — Z86718 Personal history of other venous thrombosis and embolism: Secondary | ICD-10-CM | POA: Diagnosis not present

## 2023-06-04 DIAGNOSIS — R079 Chest pain, unspecified: Secondary | ICD-10-CM | POA: Diagnosis not present

## 2023-06-04 DIAGNOSIS — R778 Other specified abnormalities of plasma proteins: Secondary | ICD-10-CM | POA: Diagnosis not present

## 2023-06-04 DIAGNOSIS — Z7902 Long term (current) use of antithrombotics/antiplatelets: Secondary | ICD-10-CM | POA: Diagnosis not present

## 2023-06-04 DIAGNOSIS — R0600 Dyspnea, unspecified: Secondary | ICD-10-CM | POA: Diagnosis not present

## 2023-06-04 DIAGNOSIS — Z7901 Long term (current) use of anticoagulants: Secondary | ICD-10-CM | POA: Diagnosis not present

## 2023-06-04 DIAGNOSIS — R062 Wheezing: Secondary | ICD-10-CM | POA: Diagnosis not present

## 2023-06-04 DIAGNOSIS — Z7982 Long term (current) use of aspirin: Secondary | ICD-10-CM | POA: Diagnosis not present

## 2023-06-04 DIAGNOSIS — I491 Atrial premature depolarization: Secondary | ICD-10-CM | POA: Diagnosis not present

## 2023-06-04 DIAGNOSIS — Z743 Need for continuous supervision: Secondary | ICD-10-CM | POA: Diagnosis not present

## 2023-06-04 DIAGNOSIS — Z79899 Other long term (current) drug therapy: Secondary | ICD-10-CM | POA: Diagnosis not present

## 2023-06-04 DIAGNOSIS — M542 Cervicalgia: Secondary | ICD-10-CM | POA: Diagnosis not present

## 2023-06-04 DIAGNOSIS — R0602 Shortness of breath: Secondary | ICD-10-CM | POA: Diagnosis not present

## 2023-06-04 DIAGNOSIS — Z87891 Personal history of nicotine dependence: Secondary | ICD-10-CM | POA: Diagnosis not present

## 2023-06-06 DIAGNOSIS — Z6835 Body mass index (BMI) 35.0-35.9, adult: Secondary | ICD-10-CM | POA: Diagnosis not present

## 2023-06-06 DIAGNOSIS — I252 Old myocardial infarction: Secondary | ICD-10-CM | POA: Diagnosis not present

## 2023-06-06 DIAGNOSIS — R600 Localized edema: Secondary | ICD-10-CM | POA: Diagnosis not present

## 2023-06-06 DIAGNOSIS — I255 Ischemic cardiomyopathy: Secondary | ICD-10-CM | POA: Diagnosis not present

## 2023-06-06 DIAGNOSIS — E789 Disorder of lipoprotein metabolism, unspecified: Secondary | ICD-10-CM | POA: Diagnosis not present

## 2023-06-06 DIAGNOSIS — I82532 Chronic embolism and thrombosis of left popliteal vein: Secondary | ICD-10-CM | POA: Diagnosis not present

## 2023-06-06 DIAGNOSIS — I10 Essential (primary) hypertension: Secondary | ICD-10-CM | POA: Diagnosis not present

## 2023-06-06 DIAGNOSIS — Z720 Tobacco use: Secondary | ICD-10-CM | POA: Diagnosis not present

## 2023-06-06 DIAGNOSIS — I2510 Atherosclerotic heart disease of native coronary artery without angina pectoris: Secondary | ICD-10-CM | POA: Diagnosis not present

## 2023-06-07 DIAGNOSIS — Z7901 Long term (current) use of anticoagulants: Secondary | ICD-10-CM | POA: Diagnosis not present

## 2023-06-14 DIAGNOSIS — Z7901 Long term (current) use of anticoagulants: Secondary | ICD-10-CM | POA: Diagnosis not present

## 2023-06-18 DIAGNOSIS — N9089 Other specified noninflammatory disorders of vulva and perineum: Secondary | ICD-10-CM | POA: Diagnosis not present

## 2023-06-19 ENCOUNTER — Encounter (INDEPENDENT_AMBULATORY_CARE_PROVIDER_SITE_OTHER): Payer: 59

## 2023-06-19 ENCOUNTER — Ambulatory Visit (INDEPENDENT_AMBULATORY_CARE_PROVIDER_SITE_OTHER): Payer: 59 | Admitting: Vascular Surgery

## 2023-06-20 DIAGNOSIS — I340 Nonrheumatic mitral (valve) insufficiency: Secondary | ICD-10-CM | POA: Diagnosis not present

## 2023-06-20 DIAGNOSIS — I3481 Nonrheumatic mitral (valve) annulus calcification: Secondary | ICD-10-CM | POA: Diagnosis not present

## 2023-06-27 ENCOUNTER — Telehealth: Payer: Self-pay

## 2023-06-27 NOTE — Patient Outreach (Signed)
  Care Coordination   06/27/2023 Name: Rachel Chen MRN: [id number] DOB: 01-24-1961   Care Coordination Outreach Attempts:  An unsuccessful telephone outreach was attempted today to offer the patient information about available care coordination services.  Follow Up Plan:  Additional outreach attempts will be made to offer the patient care coordination information and services.   Encounter Outcome:  No Answer   Care Coordination Interventions:  No, not indicated    Rowe Pavy, RN, BSN, [facility] [phone]

## 2023-06-29 DIAGNOSIS — Z7901 Long term (current) use of anticoagulants: Secondary | ICD-10-CM | POA: Diagnosis not present

## 2023-07-03 ENCOUNTER — Other Ambulatory Visit: Payer: Self-pay | Admitting: Oncology

## 2023-07-03 ENCOUNTER — Inpatient Hospital Stay
Admission: RE | Admit: 2023-07-03 | Discharge: 2023-07-03 | Disposition: A | Discharge status: Home / Self Care | Payer: Self-pay | Source: Ambulatory Visit | Attending: Gynecologic Oncology | Admitting: Gynecologic Oncology

## 2023-07-03 DIAGNOSIS — N950 Postmenopausal bleeding: Secondary | ICD-10-CM

## 2023-07-03 NOTE — Progress Notes (Signed)
Outside order for CT abdomen pelvis done at [facility] on 11/16/2022.

## 2023-07-04 ENCOUNTER — Telehealth: Payer: Self-pay

## 2023-07-04 NOTE — Telephone Encounter (Signed)
Spoke with the patient regarding the referral to GYN oncology. Patient scheduled as new patient with Dr Pricilla Holm on 07/20/2023. Patient given an arrival time of 9:15am Explained to the patient the the doctor will perform a pelvic exam at this visit. Patient given the policy that only one visitor allowed and that visitor must be over 16 yrs are allowed in the Cancer Center. Patient given the address/phone number for the clinic and that the center offers free valet service. Patient aware that masks are option.

## 2023-07-09 DIAGNOSIS — I723 Aneurysm of iliac artery: Secondary | ICD-10-CM | POA: Diagnosis not present

## 2023-07-09 DIAGNOSIS — Z7901 Long term (current) use of anticoagulants: Secondary | ICD-10-CM | POA: Diagnosis not present

## 2023-07-09 DIAGNOSIS — I10 Essential (primary) hypertension: Secondary | ICD-10-CM | POA: Diagnosis not present

## 2023-07-09 DIAGNOSIS — R32 Unspecified urinary incontinence: Secondary | ICD-10-CM | POA: Diagnosis not present

## 2023-07-09 DIAGNOSIS — Z6835 Body mass index (BMI) 35.0-35.9, adult: Secondary | ICD-10-CM | POA: Diagnosis not present

## 2023-07-09 DIAGNOSIS — F339 Major depressive disorder, recurrent, unspecified: Secondary | ICD-10-CM | POA: Diagnosis not present

## 2023-07-09 DIAGNOSIS — E785 Hyperlipidemia, unspecified: Secondary | ICD-10-CM | POA: Diagnosis not present

## 2023-07-16 ENCOUNTER — Encounter: Payer: Self-pay | Admitting: Psychiatry

## 2023-07-16 ENCOUNTER — Other Ambulatory Visit: Payer: Self-pay

## 2023-07-16 ENCOUNTER — Telehealth: Payer: PRIVATE HEALTH INSURANCE | Admitting: Psychiatry

## 2023-07-16 ENCOUNTER — Inpatient Hospital Stay: Payer: 59 | Attending: Gynecologic Oncology | Admitting: Psychiatry

## 2023-07-16 ENCOUNTER — Ambulatory Visit
Admission: RE | Admit: 2023-07-16 | Discharge: 2023-07-16 | Disposition: A | Discharge status: Home / Self Care | Payer: Self-pay | Source: Ambulatory Visit | Attending: Gynecologic Oncology | Admitting: Gynecologic Oncology

## 2023-07-16 VITALS — BP 132/53 | HR 71 | Temp 97.9°F | Resp 20 | Ht 63.5 in | Wt 212.6 lb

## 2023-07-16 DIAGNOSIS — I739 Peripheral vascular disease, unspecified: Secondary | ICD-10-CM | POA: Diagnosis not present

## 2023-07-16 DIAGNOSIS — R32 Unspecified urinary incontinence: Secondary | ICD-10-CM | POA: Diagnosis not present

## 2023-07-16 DIAGNOSIS — N898 Other specified noninflammatory disorders of vagina: Secondary | ICD-10-CM

## 2023-07-16 DIAGNOSIS — Z79899 Other long term (current) drug therapy: Secondary | ICD-10-CM | POA: Diagnosis not present

## 2023-07-16 DIAGNOSIS — I2510 Atherosclerotic heart disease of native coronary artery without angina pectoris: Secondary | ICD-10-CM | POA: Insufficient documentation

## 2023-07-16 DIAGNOSIS — F17210 Nicotine dependence, cigarettes, uncomplicated: Secondary | ICD-10-CM | POA: Insufficient documentation

## 2023-07-16 DIAGNOSIS — Z7902 Long term (current) use of antithrombotics/antiplatelets: Secondary | ICD-10-CM | POA: Insufficient documentation

## 2023-07-16 DIAGNOSIS — I7409 Other arterial embolism and thrombosis of abdominal aorta: Secondary | ICD-10-CM

## 2023-07-16 DIAGNOSIS — F32A Depression, unspecified: Secondary | ICD-10-CM | POA: Diagnosis not present

## 2023-07-16 DIAGNOSIS — N941 Unspecified dyspareunia: Secondary | ICD-10-CM | POA: Diagnosis not present

## 2023-07-16 DIAGNOSIS — Z86718 Personal history of other venous thrombosis and embolism: Secondary | ICD-10-CM | POA: Diagnosis not present

## 2023-07-16 DIAGNOSIS — Z7901 Long term (current) use of anticoagulants: Secondary | ICD-10-CM | POA: Diagnosis not present

## 2023-07-16 DIAGNOSIS — I10 Essential (primary) hypertension: Secondary | ICD-10-CM | POA: Insufficient documentation

## 2023-07-16 DIAGNOSIS — F419 Anxiety disorder, unspecified: Secondary | ICD-10-CM | POA: Insufficient documentation

## 2023-07-16 DIAGNOSIS — M549 Dorsalgia, unspecified: Secondary | ICD-10-CM | POA: Insufficient documentation

## 2023-07-16 DIAGNOSIS — I252 Old myocardial infarction: Secondary | ICD-10-CM | POA: Insufficient documentation

## 2023-07-16 DIAGNOSIS — Z955 Presence of coronary angioplasty implant and graft: Secondary | ICD-10-CM | POA: Insufficient documentation

## 2023-07-16 DIAGNOSIS — I82532 Chronic embolism and thrombosis of left popliteal vein: Secondary | ICD-10-CM

## 2023-07-16 DIAGNOSIS — R011 Cardiac murmur, unspecified: Secondary | ICD-10-CM | POA: Diagnosis not present

## 2023-07-16 LAB — AEROBIC/ANAEROBIC CULTURE W GRAM STAIN (SURGICAL/DEEP WOUND): Gram Stain: NONE SEEN

## 2023-07-16 NOTE — Patient Instructions (Signed)
It was a pleasure to see you in clinic today. - I took some biopsies today. We will see what those show - Return visit planned for next week (phone visit) to review results and next steps.  Thank you very much for allowing me to provide care for you today.  I appreciate your confidence in choosing our Gynecologic Oncology team at [facility].  If you have any questions about your visit today please call our office or send [place] a MyChart message and we will get back to you as soon as possible.

## 2023-07-16 NOTE — Progress Notes (Signed)
GYNECOLOGIC ONCOLOGY NEW PATIENT CONSULTATION  Date of Service: 07/16/2023 Referring Provider: Rhoderick Moody, MD   ASSESSMENT AND PLAN: Rachel Chen is a 62 y.o. woman with vaginal lesion.  On exam and irregular raised gray lesion is noted on the apical vaginal wall on the left.  Biopsies taken of this lesion.  When performing the biopsies, the lesion is noted to be very fibrous in nature.  This does not appear consistent with a traditional squamous cell carcinoma, so it is unclear at this time the etiology of this lesion.  I have sent tissue for pathology with an additional biopsy for culture as well.  Otherwise, patient with a normal-appearing cervix and normal uterus on palpation.  No cervical mass visualized or palpated.  Plan for phone visit in 1 week to review biopsy results discussed neck steps as indicated.   A copy of this note was sent to the patient's referring provider.  Clide Cliff, MD Gynecologic Oncology   Medical Decision Making I personally spent  TOTAL 50 minutes face-to-face and non-face-to-face in the care of this patient, which includes all pre, intra, and post visit time on the date of service.   ------------  CC: Vaginal lesion  HISTORY OF PRESENT ILLNESS:  Rachel Chen is a 62 y.o. woman who is seen in consultation at the request of Rhoderick Moody, MD for evaluation of vaginal lesion.  Patient presented to her OB/GYN for postmenopausal bleeding.  An approximate 1 cm cervical mass was noted.  This was biopsied.  Pap smear returned negative, HPV high-risk negative.  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  She completed a 7-day course of Augmentin.  On follow-up exam, she was noted to have a 0.5 mm black lesion in the vaginal wall about 1 to 2 cm to the right of the cervix.  Today patient reports spotting that started 1 year ago which turned into bright red bleeding early June of this year.  She reports that she is not currently  having any bleeding.  This stopped following treatment with antibiotics as above.  She does report some lower abdominal pain that she describes as "soreness".  Of note, patient has a history of prior DVT as well as myocardial infarction with coronary artery stent placement.  She is additionally had a femorofemoral bypass and an aortic endarterectomy.  She is currently on Coumadin and Brilinta.  She reports the DVT may have been about 4 years ago.  Her stents were placed in March 2024.  Most recently she had an echo on 06/20/2023 with an EF of 40 to 45% and moderate diastolic dysfunction.  She follows with a nurse practitioner at [facility] internal medicine for monitoring of her INR and Coumadin dosing.    PAST MEDICAL HISTORY: Past Medical History:  Diagnosis Date   Anxiety    Arthritis    Coronary artery disease    Depression    DVT (deep venous thrombosis) (HCC)    H/O endarterectomy    aorta - 09/01/21   Heart failure (HCC)    Heart murmur    Hypertension    Myocardial infarct (HCC)    Peripheral vascular disease (HCC)    aorta iliac disease   PONV (postoperative nausea and vomiting)     PAST SURGICAL HISTORY: Past Surgical History:  Procedure Laterality Date   ANTERIOR AND POSTERIOR VAGINAL REPAIR     A&P repair with mesh, mid urethral sling   AORTIC ENDARTERECETOMY  09/01/2021   BACK SURGERY  BREAST LUMPECTOMY Right    CESAREAN SECTION     x1   HEMORROIDECTOMY  2020   LOWER EXTREMITY ANGIOGRAPHY N/A 08/26/2018   Procedure: LOWER EXTREMITY ANGIOGRAPHY;  Surgeon: Maeola Harman, MD;  Location: [facility] INVASIVE CV LAB;  Service: Cardiovascular;  Laterality: N/A;   PERIPHERAL VASCULAR INTERVENTION Right 08/26/2018   Procedure: PERIPHERAL VASCULAR INTERVENTION;  Surgeon: Maeola Harman, MD;  Location: [facility] INVASIVE CV LAB;  Service: Cardiovascular;  Laterality: Right;  Common iliac   right knee arthroscopy      SALPINGOOPHORECTOMY Left    ectopic pregnancy    TENOPLASTY HAMSTRING / FEMUR Right    TOTAL KNEE ARTHROPLASTY Left 10/24/2021   Procedure: TOTAL KNEE ARTHROPLASTY;  Surgeon: Dannielle Huh, MD;  Location: [facility];  Service: Orthopedics;  Laterality: Left;   TUBAL LIGATION Bilateral    VASCULAR SURGERY  2019   fem-fem BPG    OB/GYN HISTORY: OB History  Gravida Para Term Preterm AB Living  4 2 1 1 2 1   SAB IAB Ectopic Multiple Live Births  1   1   1     # Outcome Date GA Lbr Len/2nd Weight Sex Type Anes PTL Lv  4 Ectopic           3 SAB           2 Term      CS-LTranv   LIV  1 Preterm      Vag-Spont   FD      Age at menarche: 57 Age at menopause: 34 Hx of HRT: No Hx of STI: No Last pap: 05/30/23 NILM, HPV HR neg History of abnormal pap smears: No  SCREENING STUDIES:  Last mammogram: 2023 Last colonoscopy: 2023  MEDICATIONS:  Current Outpatient Medications:    ALPRAZolam (XANAX) 1 MG tablet, Take 1 mg by mouth 3 (three) times daily as needed for anxiety., Disp: , Rfl:    amitriptyline (ELAVIL) 25 MG tablet, Take 25 mg by mouth at bedtime., Disp: , Rfl:    amLODipine (NORVASC) 5 MG tablet, Take 1 tablet by mouth daily., Disp: , Rfl:    ARIPiprazole (ABILIFY) 2 MG tablet, Take 2 mg by mouth daily., Disp: , Rfl:    atorvastatin (LIPITOR) 80 MG tablet, Take 80 mg by mouth at bedtime., Disp: , Rfl:    BRILINTA 90 MG TABS tablet, Take 90 mg by mouth 2 (two) times daily., Disp: , Rfl:    carvedilol (COREG) 6.25 MG tablet, Take 6.25 mg by mouth 2 (two) times daily., Disp: , Rfl: 6   ezetimibe (ZETIA) 10 MG tablet, Take 10 mg by mouth at bedtime., Disp: , Rfl:    furosemide (LASIX) 40 MG tablet, Take 40 mg by mouth daily., Disp: , Rfl:    gabapentin (NEURONTIN) 300 MG capsule, Take 300 mg by mouth 3 (three) times daily., Disp: , Rfl: 2   lisinopril (PRINIVIL,ZESTRIL) 40 MG tablet, Take 40 mg by mouth daily. , Disp: , Rfl:    Multiple Minerals-Vitamins (CAL MAG ZINC +D3) TABS, Take 3 tablets by mouth daily., Disp: , Rfl:    Multiple  Vitamins-Minerals (PRESERVISION AREDS 2+MULTI VIT PO), Take 1 capsule by mouth daily., Disp: , Rfl:    Potassium 99 MG TABS, Take 99 mg by mouth daily., Disp: , Rfl:    spironolactone (ALDACTONE) 25 MG tablet, Take 25 mg by mouth daily., Disp: , Rfl:    venlafaxine XR (EFFEXOR-XR) 75 MG 24 hr capsule, Take 225 mg by mouth daily with breakfast.,  Disp: , Rfl:    warfarin (COUMADIN) 6 MG tablet, Take 6 mg by mouth at bedtime., Disp: , Rfl:   ALLERGIES: No Known Allergies  FAMILY HISTORY: Family History  Problem Relation Age of Onset   Breast cancer Neg Hx    Ovarian cancer Neg Hx    Endometrial cancer Neg Hx    Colon cancer Neg Hx     SOCIAL HISTORY: Social History   Socioeconomic History   Marital status: Single    Spouse name: Not on file   Number of children: Not on file   Years of education: Not on file   Highest education level: Not on file  Occupational History   Not on file  Tobacco Use   Smoking status: Every Day    Current packs/day: 0.25    Average packs/day: 1 pack/day for 41.6 years (41.3 ttl pk-yrs)    Types: Cigarettes    Start date: 74   Smokeless tobacco: Never  Vaping Use   Vaping status: Former  Substance and Sexual Activity   Alcohol use: Yes    Comment: occassional   Drug use: Never   Sexual activity: Not Currently    Partners: Male  Other Topics Concern   Not on file  Social History Narrative   Not on file   Social Determinants of Health   Financial Resource Strain: Not on file  Food Insecurity: Not on file  Transportation Needs: Not on file  Physical Activity: Not on file  Stress: Not on file  Social Connections: Not on file  Intimate Partner Violence: Not on file    REVIEW OF SYSTEMS: New patient intake form was reviewed.  Complete 10-system review is negative except for the following: Back pain, decreased concentration, fatigue, pain with intercourse, incontinence, depression  PHYSICAL EXAM: BP (!) 132/53   Pulse 71   Temp 97.9  F (36.6 C)   Resp 20   Ht 5' 3.5" (1.613 m)   Wt 212 lb 9.6 oz (96.4 kg)   SpO2 98%   BMI 37.07 kg/m  Constitutional: No acute distress. Neuro/Psych: Alert, oriented.  Head and Neck: Normocephalic, atraumatic. Neck symmetric without masses. Sclera anicteric.  Respiratory: Normal work of breathing.  Rhonchi in bilateral lung bases, occasional wheeze Cardiovascular: Regular rate and rhythm, no murmurs, rubs, or gallops. Abdomen: Normoactive bowel sounds. Soft, non-distended, non-tender to palpation.No masses. Midline incision from aortic endarterectomy  Extremities: Grossly normal range of motion. Warm, well perfused. No edema bilaterally. Skin: No rashes or lesions. Lymphatic: No cervical, supraclavicular, or inguinal adenopathy. Genitourinary: External genitalia without lesions. Urethral meatus without lesions or prolapse. On speculum exam, normal cervix without lesion.  Left vaginal wall near the apex with a gray raised irregular lesion approximately 5 to 7 mm in size. Bimanual exam reveals normal cervix and small mobile uterus.  No adnexal masses.  Nodular irregular nature of left apical lesion palpated. Exam chaperoned by Kimberly Swaziland, CMA  VAGINAL BIOPSY  ROMILDA MCCREA is a 62 y.o. woman who presents today for a vulvar biopsy, see details of the visit above.  The procedure was explained to the patient and verbal consent was obtained prior to the procedure.  A speculum was inserted into the vagina.  The vaginal mucosa was cleaned with Betadine.  1 ml of 1% lidocaine was injected at the planned biopsy site.  A Tischler biopsy forcep was used to obtain 2 biopsy specimens (one for pathology and one for culture). With removal, tissue with appearance of fibers.  Hemostasis was achieved with silver nitrate.  Patient tolerated the procedure well.   LABORATORY AND RADIOLOGIC DATA: Outside medical records were reviewed to synthesize the above history, along with the history and physical  obtained during the visit.  Outside laboratory, pathology, and imaging reports were reviewed, with pertinent results below.    WBC  Date Value Ref Range Status  10/25/2021 10.3 4.0 - 10.5 K/uL Final   Hemoglobin  Date Value Ref Range Status  10/25/2021 9.3 (L) 12.0 - 15.0 g/dL Final   HCT  Date Value Ref Range Status  10/25/2021 31.0 (L) 36.0 - 46.0 % Final   Platelets  Date Value Ref Range Status  10/25/2021 252 150 - 400 K/uL Final   Creatinine, Ser  Date Value Ref Range Status  10/25/2021 0.75 0.44 - 1.00 mg/dL Final   AST  Date Value Ref Range Status  10/12/2021 18 15 - 41 U/L Final   ALT  Date Value Ref Range Status  10/12/2021 17 0 - 44 U/L Final    Surgical pathology (05/30/23): Cervical biopsy: Aggregates of superficial benign squamous cells, Actinomyces bacterial organisms, acute inflammation, and blood.  Pelvic ultrasound (05/31/23): Endometrial stripe 1.8 mm Uterus measurements 6.6 x 4.5 x 2.8 cm Fibroids noted: 1.3 x 0.9 cm, 0.7 x 0.7 cm, 1.2 x 1.3 cm Area felt to represent the right ovary appears within normal limits. Patient reports left ovary surgically removed. No free fluid. No obvious focal mass noted

## 2023-07-17 ENCOUNTER — Encounter: Payer: Self-pay | Admitting: Psychiatry

## 2023-07-18 ENCOUNTER — Other Ambulatory Visit: Payer: Self-pay

## 2023-07-20 ENCOUNTER — Ambulatory Visit: Payer: PRIVATE HEALTH INSURANCE | Admitting: Gynecologic Oncology

## 2023-07-23 ENCOUNTER — Inpatient Hospital Stay: Payer: PRIVATE HEALTH INSURANCE | Attending: Psychiatry | Admitting: Psychiatry

## 2023-07-23 ENCOUNTER — Encounter: Payer: Self-pay | Admitting: Psychiatry

## 2023-07-23 DIAGNOSIS — A429 Actinomycosis, unspecified: Secondary | ICD-10-CM | POA: Diagnosis not present

## 2023-07-23 DIAGNOSIS — N898 Other specified noninflammatory disorders of vagina: Secondary | ICD-10-CM | POA: Insufficient documentation

## 2023-07-23 NOTE — Progress Notes (Signed)
Gynecologic Oncology Telehealth Follow-Up Note  I connected with [name] on 07/23/23 at  4:00 PM EDT by telephone and verified that I am speaking with the correct person using two identifiers.  I discussed the limitations, risks, security and privacy concerns of performing an evaluation and management service by telemedicine and the availability of in-person appointments. I also discussed with the patient that there may be a patient responsible charge related to this service. The patient expressed understanding and agreed to proceed.  Other persons participating in the visit and their role in the encounter: none.  Patient's location: Home, [place] Provider's location: [facility]  Date of Service: 07/23/2023 Referring Provider: Rhoderick Moody, MD   Assessment & Plan: Rachel Chen is a 62 y.o. woman with a vaginal wall lesion consistent with actinomyces.  Reviewed biopsy and culture results with the patient.  Patient has received treatment with Augmentin x 1 week via her OB/GYN, but reviewed with patient that treatment for actinomyces may require more prolonged antibiotic courses.  Some of the tissue was removed with her biopsy but there likely is some residual left on the vaginal wall.  Recommend referral to infectious disease for recommendation of definitive treatment her actinomyces.  Not certain that a surgical intervention is warranted at this time, but if infectious disease feels that this would be helpful, would be happy to reconsider.  Patient prefers to be seen in [facility].  Plan referral to [facility] in [facility].    RTC PRN pending ID evaluation.  Clide Cliff, MD Gynecologic Oncology   Medical Decision Making I personally spent  TOTAL 20 minutes face-to-face and non-face-to-face in the care of this patient, which includes all pre, intra, and post visit time on the date of service.  2 minutes spent reviewing records prior to the visit 15 Minutes in  patient contact via phone 3 minutes charting , conferring with consultants etc.   ----------------------- Reason for Visit: Follow-up  Treatment History: Patient presented to her OB/GYN for postmenopausal bleeding on 05/30/23. An approximate 1 cm cervical mass was noted. This was biopsied. Pap smear returned negative, HPV high-risk negative. 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 She completed a 7-day course of Augmentin. On follow-up exam, she was noted to have a 0.5 mm black lesion in the vaginal wall about 1 to 2 cm to the right of the cervix.   She was seen by me on 07/16/23 at which time a repeat biopsy was obtained of the vaginal lesion. Pathology returned with colonies of actinomyces.  Interval History: Since her last visit, patient reports that she is not having bleeding.  She does note some during the last 2 days.  She has not noticed her lower abdominal soreness as much recently as she is not having a flare of sciatic nerve pain that radiates into hip and knee.   Past Medical/Surgical History: Past Medical History:  Diagnosis Date   Anxiety    Arthritis    Coronary artery disease    Depression    DVT (deep venous thrombosis) (HCC)    H/O endarterectomy    aorta - 09/01/21   Heart failure (HCC)    Heart murmur    Hypertension    Myocardial infarct (HCC)    Peripheral vascular disease (HCC)    aorta iliac disease   PONV (postoperative nausea and vomiting)     Past Surgical History:  Procedure Laterality Date   ANTERIOR AND POSTERIOR VAGINAL REPAIR  A&P repair with mesh, mid urethral sling   AORTIC ENDARTERECETOMY  09/01/2021   BACK SURGERY     BREAST LUMPECTOMY Right    CESAREAN SECTION     x1   HEMORROIDECTOMY  2020   LOWER EXTREMITY ANGIOGRAPHY N/A 08/26/2018   Procedure: LOWER EXTREMITY ANGIOGRAPHY;  Surgeon: Maeola Harman, MD;  Location: [facility] INVASIVE CV LAB;  Service: Cardiovascular;  Laterality: N/A;   PERIPHERAL VASCULAR  INTERVENTION Right 08/26/2018   Procedure: PERIPHERAL VASCULAR INTERVENTION;  Surgeon: Maeola Harman, MD;  Location: [facility] INVASIVE CV LAB;  Service: Cardiovascular;  Laterality: Right;  Common iliac   right knee arthroscopy      SALPINGOOPHORECTOMY Left    ectopic pregnancy   TENOPLASTY HAMSTRING / FEMUR Right    TOTAL KNEE ARTHROPLASTY Left 10/24/2021   Procedure: TOTAL KNEE ARTHROPLASTY;  Surgeon: Dannielle Huh, MD;  Location: [facility];  Service: Orthopedics;  Laterality: Left;   TUBAL LIGATION Bilateral    VASCULAR SURGERY  2019   fem-fem BPG    Family History  Problem Relation Age of Onset   Breast cancer Neg Hx    Ovarian cancer Neg Hx    Endometrial cancer Neg Hx    Colon cancer Neg Hx     Social History   Socioeconomic History   Marital status: Single    Spouse name: Not on file   Number of children: Not on file   Years of education: Not on file   Highest education level: Not on file  Occupational History   Not on file  Tobacco Use   Smoking status: Every Day    Current packs/day: 0.25    Average packs/day: 1 pack/day for 41.6 years (41.3 ttl pk-yrs)    Types: Cigarettes    Start date: 32   Smokeless tobacco: Never  Vaping Use   Vaping status: Former  Substance and Sexual Activity   Alcohol use: Yes    Comment: occassional   Drug use: Never   Sexual activity: Not Currently    Partners: Male  Other Topics Concern   Not on file  Social History Narrative   Not on file   Social Determinants of Health   Financial Resource Strain: Not on file  Food Insecurity: Not on file  Transportation Needs: Not on file  Physical Activity: Not on file  Stress: Not on file  Social Connections: Not on file    Current Medications:  Current Outpatient Medications:    ALPRAZolam (XANAX) 1 MG tablet, Take 1 mg by mouth 3 (three) times daily as needed for anxiety., Disp: , Rfl:    amitriptyline (ELAVIL) 25 MG tablet, Take 25 mg by mouth at bedtime., Disp: , Rfl:     amLODipine (NORVASC) 5 MG tablet, Take 1 tablet by mouth daily., Disp: , Rfl:    ARIPiprazole (ABILIFY) 2 MG tablet, Take 2 mg by mouth daily., Disp: , Rfl:    atorvastatin (LIPITOR) 80 MG tablet, Take 80 mg by mouth at bedtime., Disp: , Rfl:    BRILINTA 90 MG TABS tablet, Take 90 mg by mouth 2 (two) times daily., Disp: , Rfl:    carvedilol (COREG) 6.25 MG tablet, Take 6.25 mg by mouth 2 (two) times daily., Disp: , Rfl: 6   ezetimibe (ZETIA) 10 MG tablet, Take 10 mg by mouth at bedtime., Disp: , Rfl:    furosemide (LASIX) 40 MG tablet, Take 40 mg by mouth daily., Disp: , Rfl:    gabapentin (NEURONTIN) 300 MG capsule, Take 300  mg by mouth 3 (three) times daily., Disp: , Rfl: 2   lisinopril (PRINIVIL,ZESTRIL) 40 MG tablet, Take 40 mg by mouth daily. , Disp: , Rfl:    Multiple Minerals-Vitamins (CAL MAG ZINC +D3) TABS, Take 3 tablets by mouth daily., Disp: , Rfl:    Multiple Vitamins-Minerals (PRESERVISION AREDS 2+MULTI VIT PO), Take 1 capsule by mouth daily., Disp: , Rfl:    Potassium 99 MG TABS, Take 99 mg by mouth daily., Disp: , Rfl:    spironolactone (ALDACTONE) 25 MG tablet, Take 25 mg by mouth daily., Disp: , Rfl:    venlafaxine XR (EFFEXOR-XR) 75 MG 24 hr capsule, Take 225 mg by mouth daily with breakfast., Disp: , Rfl:    warfarin (COUMADIN) 6 MG tablet, Take 6 mg by mouth at bedtime., Disp: , Rfl:   Review of Symptoms: Pertinent positives as per HPI.  Physical Exam: Deferred given limitations of phone visit.  Laboratory & Radiologic Studies: Surgical pathology (07/16/23): FINAL MICROSCOPIC DIAGNOSIS:   A. LEFT VAGINAL WALL AT APEX, LESION, BIOPSY:  Superficial fragments of squamous epithelium admixed with blood and  inflammatory debris containing calcifications and colonies of  actinomyces  No tumor seen   COMMENT:   Deeper sections are reviewed.

## 2023-07-24 DIAGNOSIS — Z7901 Long term (current) use of anticoagulants: Secondary | ICD-10-CM | POA: Diagnosis not present

## 2023-07-25 ENCOUNTER — Telehealth: Payer: Self-pay

## 2023-07-25 NOTE — Telephone Encounter (Signed)
Per Dr Alvester Morin... sent referral to [facility] [place].  Faxed [phone].

## 2023-08-07 DIAGNOSIS — E782 Mixed hyperlipidemia: Secondary | ICD-10-CM | POA: Diagnosis not present

## 2023-08-07 DIAGNOSIS — Z86718 Personal history of other venous thrombosis and embolism: Secondary | ICD-10-CM | POA: Diagnosis not present

## 2023-08-07 DIAGNOSIS — I10 Essential (primary) hypertension: Secondary | ICD-10-CM | POA: Diagnosis not present

## 2023-08-07 DIAGNOSIS — I252 Old myocardial infarction: Secondary | ICD-10-CM | POA: Diagnosis not present

## 2023-08-07 DIAGNOSIS — I2510 Atherosclerotic heart disease of native coronary artery without angina pectoris: Secondary | ICD-10-CM | POA: Diagnosis not present

## 2023-08-07 DIAGNOSIS — Z6835 Body mass index (BMI) 35.0-35.9, adult: Secondary | ICD-10-CM | POA: Diagnosis not present

## 2023-08-08 DIAGNOSIS — I2510 Atherosclerotic heart disease of native coronary artery without angina pectoris: Secondary | ICD-10-CM | POA: Diagnosis not present

## 2023-08-08 DIAGNOSIS — Z720 Tobacco use: Secondary | ICD-10-CM | POA: Diagnosis not present

## 2023-08-08 DIAGNOSIS — I255 Ischemic cardiomyopathy: Secondary | ICD-10-CM | POA: Diagnosis not present

## 2023-08-08 DIAGNOSIS — I252 Old myocardial infarction: Secondary | ICD-10-CM | POA: Diagnosis not present

## 2023-08-08 DIAGNOSIS — I10 Essential (primary) hypertension: Secondary | ICD-10-CM | POA: Diagnosis not present

## 2023-08-08 DIAGNOSIS — I82532 Chronic embolism and thrombosis of left popliteal vein: Secondary | ICD-10-CM | POA: Diagnosis not present

## 2023-08-08 DIAGNOSIS — R600 Localized edema: Secondary | ICD-10-CM | POA: Diagnosis not present

## 2023-08-08 DIAGNOSIS — Z7901 Long term (current) use of anticoagulants: Secondary | ICD-10-CM | POA: Diagnosis not present

## 2023-08-09 DIAGNOSIS — N950 Postmenopausal bleeding: Secondary | ICD-10-CM | POA: Diagnosis not present

## 2023-08-09 DIAGNOSIS — Z720 Tobacco use: Secondary | ICD-10-CM | POA: Diagnosis not present

## 2023-08-09 DIAGNOSIS — A429 Actinomycosis, unspecified: Secondary | ICD-10-CM | POA: Diagnosis not present

## 2023-08-10 DIAGNOSIS — Z6835 Body mass index (BMI) 35.0-35.9, adult: Secondary | ICD-10-CM | POA: Diagnosis not present

## 2023-08-10 DIAGNOSIS — Z7901 Long term (current) use of anticoagulants: Secondary | ICD-10-CM | POA: Diagnosis not present

## 2023-08-13 DIAGNOSIS — E785 Hyperlipidemia, unspecified: Secondary | ICD-10-CM | POA: Diagnosis not present

## 2023-08-13 DIAGNOSIS — I5042 Chronic combined systolic (congestive) and diastolic (congestive) heart failure: Secondary | ICD-10-CM | POA: Diagnosis not present

## 2023-08-13 DIAGNOSIS — E559 Vitamin D deficiency, unspecified: Secondary | ICD-10-CM | POA: Diagnosis not present

## 2023-08-13 DIAGNOSIS — Z86718 Personal history of other venous thrombosis and embolism: Secondary | ICD-10-CM | POA: Diagnosis not present

## 2023-08-13 DIAGNOSIS — J302 Other seasonal allergic rhinitis: Secondary | ICD-10-CM | POA: Diagnosis not present

## 2023-08-13 DIAGNOSIS — R457 State of emotional shock and stress, unspecified: Secondary | ICD-10-CM | POA: Diagnosis not present

## 2023-08-13 DIAGNOSIS — F410 Panic disorder [episodic paroxysmal anxiety] without agoraphobia: Secondary | ICD-10-CM | POA: Diagnosis not present

## 2023-08-13 DIAGNOSIS — G4700 Insomnia, unspecified: Secondary | ICD-10-CM | POA: Diagnosis not present

## 2023-08-13 DIAGNOSIS — E669 Obesity, unspecified: Secondary | ICD-10-CM | POA: Diagnosis not present

## 2023-08-13 DIAGNOSIS — I110 Hypertensive heart disease with heart failure: Secondary | ICD-10-CM | POA: Diagnosis not present

## 2023-08-13 DIAGNOSIS — Z6837 Body mass index (BMI) 37.0-37.9, adult: Secondary | ICD-10-CM | POA: Diagnosis not present

## 2023-08-13 DIAGNOSIS — S0990XA Unspecified injury of head, initial encounter: Secondary | ICD-10-CM | POA: Diagnosis not present

## 2023-08-13 DIAGNOSIS — I10 Essential (primary) hypertension: Secondary | ICD-10-CM | POA: Diagnosis not present

## 2023-08-13 DIAGNOSIS — D509 Iron deficiency anemia, unspecified: Secondary | ICD-10-CM | POA: Diagnosis not present

## 2023-08-13 DIAGNOSIS — I2510 Atherosclerotic heart disease of native coronary artery without angina pectoris: Secondary | ICD-10-CM | POA: Diagnosis not present

## 2023-08-13 DIAGNOSIS — S0510XA Contusion of eyeball and orbital tissues, unspecified eye, initial encounter: Secondary | ICD-10-CM | POA: Diagnosis not present

## 2023-08-13 DIAGNOSIS — W1839XA Other fall on same level, initial encounter: Secondary | ICD-10-CM | POA: Diagnosis not present

## 2023-08-13 DIAGNOSIS — S0081XA Abrasion of other part of head, initial encounter: Secondary | ICD-10-CM | POA: Diagnosis not present

## 2023-08-13 DIAGNOSIS — F411 Generalized anxiety disorder: Secondary | ICD-10-CM | POA: Diagnosis not present

## 2023-08-13 DIAGNOSIS — Z7901 Long term (current) use of anticoagulants: Secondary | ICD-10-CM | POA: Diagnosis not present

## 2023-08-13 DIAGNOSIS — F32A Depression, unspecified: Secondary | ICD-10-CM | POA: Diagnosis not present

## 2023-08-13 DIAGNOSIS — R079 Chest pain, unspecified: Secondary | ICD-10-CM | POA: Diagnosis not present

## 2023-08-13 DIAGNOSIS — I255 Ischemic cardiomyopathy: Secondary | ICD-10-CM | POA: Diagnosis not present

## 2023-08-13 DIAGNOSIS — S199XXA Unspecified injury of neck, initial encounter: Secondary | ICD-10-CM | POA: Diagnosis not present

## 2023-08-13 DIAGNOSIS — F17210 Nicotine dependence, cigarettes, uncomplicated: Secondary | ICD-10-CM | POA: Diagnosis not present

## 2023-08-13 DIAGNOSIS — Z20822 Contact with and (suspected) exposure to covid-19: Secondary | ICD-10-CM | POA: Diagnosis not present

## 2023-08-13 DIAGNOSIS — Y92238 Other place in hospital as the place of occurrence of the external cause: Secondary | ICD-10-CM | POA: Diagnosis not present

## 2023-08-13 DIAGNOSIS — R55 Syncope and collapse: Secondary | ICD-10-CM | POA: Diagnosis not present

## 2023-08-13 DIAGNOSIS — R918 Other nonspecific abnormal finding of lung field: Secondary | ICD-10-CM | POA: Diagnosis not present

## 2023-08-13 DIAGNOSIS — R0902 Hypoxemia: Secondary | ICD-10-CM | POA: Diagnosis not present

## 2023-08-13 DIAGNOSIS — R0602 Shortness of breath: Secondary | ICD-10-CM | POA: Diagnosis not present

## 2023-08-13 DIAGNOSIS — J9601 Acute respiratory failure with hypoxia: Secondary | ICD-10-CM | POA: Diagnosis not present

## 2023-08-13 DIAGNOSIS — E871 Hypo-osmolality and hyponatremia: Secondary | ICD-10-CM | POA: Diagnosis not present

## 2023-08-13 DIAGNOSIS — R7303 Prediabetes: Secondary | ICD-10-CM | POA: Diagnosis not present

## 2023-08-14 DIAGNOSIS — R0902 Hypoxemia: Secondary | ICD-10-CM | POA: Diagnosis not present

## 2023-08-14 DIAGNOSIS — I255 Ischemic cardiomyopathy: Secondary | ICD-10-CM | POA: Diagnosis not present

## 2023-08-14 DIAGNOSIS — R0602 Shortness of breath: Secondary | ICD-10-CM | POA: Diagnosis not present

## 2023-08-14 DIAGNOSIS — R0603 Acute respiratory distress: Secondary | ICD-10-CM | POA: Diagnosis not present

## 2023-08-14 DIAGNOSIS — R55 Syncope and collapse: Secondary | ICD-10-CM | POA: Diagnosis not present

## 2023-08-14 DIAGNOSIS — R7989 Other specified abnormal findings of blood chemistry: Secondary | ICD-10-CM | POA: Diagnosis not present

## 2023-08-15 DIAGNOSIS — R55 Syncope and collapse: Secondary | ICD-10-CM | POA: Diagnosis not present

## 2023-08-23 DIAGNOSIS — M160 Bilateral primary osteoarthritis of hip: Secondary | ICD-10-CM | POA: Diagnosis not present

## 2023-08-23 DIAGNOSIS — M1611 Unilateral primary osteoarthritis, right hip: Secondary | ICD-10-CM | POA: Diagnosis not present

## 2023-08-23 DIAGNOSIS — M5136 Other intervertebral disc degeneration, lumbar region: Secondary | ICD-10-CM | POA: Diagnosis not present

## 2023-08-23 DIAGNOSIS — M47816 Spondylosis without myelopathy or radiculopathy, lumbar region: Secondary | ICD-10-CM | POA: Diagnosis not present

## 2023-08-24 DIAGNOSIS — M5136 Other intervertebral disc degeneration, lumbar region: Secondary | ICD-10-CM | POA: Diagnosis not present

## 2023-08-24 DIAGNOSIS — R0602 Shortness of breath: Secondary | ICD-10-CM | POA: Diagnosis not present

## 2023-08-24 DIAGNOSIS — Z79899 Other long term (current) drug therapy: Secondary | ICD-10-CM | POA: Diagnosis not present

## 2023-08-24 DIAGNOSIS — K219 Gastro-esophageal reflux disease without esophagitis: Secondary | ICD-10-CM | POA: Diagnosis not present

## 2023-08-24 DIAGNOSIS — F411 Generalized anxiety disorder: Secondary | ICD-10-CM | POA: Diagnosis not present

## 2023-08-24 DIAGNOSIS — Z87891 Personal history of nicotine dependence: Secondary | ICD-10-CM | POA: Diagnosis not present

## 2023-08-24 DIAGNOSIS — G8929 Other chronic pain: Secondary | ICD-10-CM | POA: Diagnosis not present

## 2023-08-24 DIAGNOSIS — M5441 Lumbago with sciatica, right side: Secondary | ICD-10-CM | POA: Diagnosis not present

## 2023-08-24 DIAGNOSIS — F410 Panic disorder [episodic paroxysmal anxiety] without agoraphobia: Secondary | ICD-10-CM | POA: Diagnosis not present

## 2023-08-24 DIAGNOSIS — F331 Major depressive disorder, recurrent, moderate: Secondary | ICD-10-CM | POA: Diagnosis not present

## 2023-08-24 DIAGNOSIS — I2510 Atherosclerotic heart disease of native coronary artery without angina pectoris: Secondary | ICD-10-CM | POA: Diagnosis not present

## 2023-08-29 DIAGNOSIS — M1611 Unilateral primary osteoarthritis, right hip: Secondary | ICD-10-CM | POA: Diagnosis not present

## 2023-09-06 DIAGNOSIS — I491 Atrial premature depolarization: Secondary | ICD-10-CM | POA: Diagnosis not present

## 2023-09-06 DIAGNOSIS — R55 Syncope and collapse: Secondary | ICD-10-CM | POA: Diagnosis not present

## 2023-09-06 DIAGNOSIS — I4719 Other supraventricular tachycardia: Secondary | ICD-10-CM | POA: Diagnosis not present

## 2023-09-17 DIAGNOSIS — F329 Major depressive disorder, single episode, unspecified: Secondary | ICD-10-CM | POA: Diagnosis not present

## 2023-09-24 DIAGNOSIS — M47816 Spondylosis without myelopathy or radiculopathy, lumbar region: Secondary | ICD-10-CM | POA: Diagnosis not present

## 2023-09-24 DIAGNOSIS — M1611 Unilateral primary osteoarthritis, right hip: Secondary | ICD-10-CM | POA: Diagnosis not present

## 2023-09-24 DIAGNOSIS — M5136 Other intervertebral disc degeneration, lumbar region with discogenic back pain only: Secondary | ICD-10-CM | POA: Diagnosis not present

## 2023-09-27 DIAGNOSIS — I10 Essential (primary) hypertension: Secondary | ICD-10-CM | POA: Diagnosis not present

## 2023-09-27 DIAGNOSIS — E789 Disorder of lipoprotein metabolism, unspecified: Secondary | ICD-10-CM | POA: Diagnosis not present

## 2023-09-27 DIAGNOSIS — I255 Ischemic cardiomyopathy: Secondary | ICD-10-CM | POA: Diagnosis not present

## 2023-09-27 DIAGNOSIS — I7409 Other arterial embolism and thrombosis of abdominal aorta: Secondary | ICD-10-CM | POA: Diagnosis not present

## 2023-09-27 DIAGNOSIS — I2510 Atherosclerotic heart disease of native coronary artery without angina pectoris: Secondary | ICD-10-CM | POA: Diagnosis not present

## 2023-09-27 DIAGNOSIS — Z7901 Long term (current) use of anticoagulants: Secondary | ICD-10-CM | POA: Diagnosis not present

## 2023-09-27 DIAGNOSIS — I252 Old myocardial infarction: Secondary | ICD-10-CM | POA: Diagnosis not present

## 2023-09-27 DIAGNOSIS — I82532 Chronic embolism and thrombosis of left popliteal vein: Secondary | ICD-10-CM | POA: Diagnosis not present

## 2023-10-03 ENCOUNTER — Encounter: Payer: Self-pay | Admitting: Obstetrics and Gynecology

## 2023-10-11 DIAGNOSIS — M51369 Other intervertebral disc degeneration, lumbar region without mention of lumbar back pain or lower extremity pain: Secondary | ICD-10-CM | POA: Diagnosis not present

## 2023-10-11 DIAGNOSIS — M5126 Other intervertebral disc displacement, lumbar region: Secondary | ICD-10-CM | POA: Diagnosis not present

## 2023-10-11 DIAGNOSIS — M47816 Spondylosis without myelopathy or radiculopathy, lumbar region: Secondary | ICD-10-CM | POA: Diagnosis not present

## 2023-10-11 DIAGNOSIS — M4807 Spinal stenosis, lumbosacral region: Secondary | ICD-10-CM | POA: Diagnosis not present

## 2023-10-11 DIAGNOSIS — M5136 Other intervertebral disc degeneration, lumbar region with discogenic back pain only: Secondary | ICD-10-CM | POA: Diagnosis not present

## 2023-10-11 DIAGNOSIS — M48061 Spinal stenosis, lumbar region without neurogenic claudication: Secondary | ICD-10-CM | POA: Diagnosis not present

## 2023-10-18 DIAGNOSIS — M5136 Other intervertebral disc degeneration, lumbar region with discogenic back pain only: Secondary | ICD-10-CM | POA: Diagnosis not present

## 2023-10-18 DIAGNOSIS — M48061 Spinal stenosis, lumbar region without neurogenic claudication: Secondary | ICD-10-CM | POA: Diagnosis not present

## 2023-10-18 DIAGNOSIS — M5416 Radiculopathy, lumbar region: Secondary | ICD-10-CM | POA: Diagnosis not present

## 2023-10-18 DIAGNOSIS — M47816 Spondylosis without myelopathy or radiculopathy, lumbar region: Secondary | ICD-10-CM | POA: Diagnosis not present

## 2023-10-22 DIAGNOSIS — M5416 Radiculopathy, lumbar region: Secondary | ICD-10-CM | POA: Diagnosis not present

## 2023-10-22 DIAGNOSIS — M5126 Other intervertebral disc displacement, lumbar region: Secondary | ICD-10-CM | POA: Diagnosis not present

## 2023-10-24 DIAGNOSIS — Z2821 Immunization not carried out because of patient refusal: Secondary | ICD-10-CM | POA: Diagnosis not present

## 2023-10-24 DIAGNOSIS — Z6834 Body mass index (BMI) 34.0-34.9, adult: Secondary | ICD-10-CM | POA: Diagnosis not present

## 2023-10-24 DIAGNOSIS — D509 Iron deficiency anemia, unspecified: Secondary | ICD-10-CM | POA: Diagnosis not present

## 2023-10-24 DIAGNOSIS — F410 Panic disorder [episodic paroxysmal anxiety] without agoraphobia: Secondary | ICD-10-CM | POA: Diagnosis not present

## 2023-10-24 DIAGNOSIS — Z5320 Procedure and treatment not carried out because of patient's decision for unspecified reasons: Secondary | ICD-10-CM | POA: Diagnosis not present

## 2023-10-24 DIAGNOSIS — Z0000 Encounter for general adult medical examination without abnormal findings: Secondary | ICD-10-CM | POA: Diagnosis not present

## 2023-10-24 DIAGNOSIS — F331 Major depressive disorder, recurrent, moderate: Secondary | ICD-10-CM | POA: Diagnosis not present

## 2023-10-24 DIAGNOSIS — F411 Generalized anxiety disorder: Secondary | ICD-10-CM | POA: Diagnosis not present

## 2023-10-24 DIAGNOSIS — Z1231 Encounter for screening mammogram for malignant neoplasm of breast: Secondary | ICD-10-CM | POA: Diagnosis not present

## 2023-10-24 DIAGNOSIS — Z79899 Other long term (current) drug therapy: Secondary | ICD-10-CM | POA: Diagnosis not present

## 2023-10-24 DIAGNOSIS — Z131 Encounter for screening for diabetes mellitus: Secondary | ICD-10-CM | POA: Diagnosis not present

## 2023-10-31 DIAGNOSIS — Z1231 Encounter for screening mammogram for malignant neoplasm of breast: Secondary | ICD-10-CM | POA: Diagnosis not present

## 2023-11-05 DIAGNOSIS — F329 Major depressive disorder, single episode, unspecified: Secondary | ICD-10-CM | POA: Diagnosis not present

## 2023-11-07 DIAGNOSIS — M545 Low back pain, unspecified: Secondary | ICD-10-CM | POA: Diagnosis not present

## 2023-11-08 DIAGNOSIS — Z1231 Encounter for screening mammogram for malignant neoplasm of breast: Secondary | ICD-10-CM | POA: Diagnosis not present

## 2023-11-23 DIAGNOSIS — I10 Essential (primary) hypertension: Secondary | ICD-10-CM | POA: Diagnosis not present

## 2023-11-23 DIAGNOSIS — I2510 Atherosclerotic heart disease of native coronary artery without angina pectoris: Secondary | ICD-10-CM | POA: Diagnosis not present

## 2023-11-23 DIAGNOSIS — Z87891 Personal history of nicotine dependence: Secondary | ICD-10-CM | POA: Diagnosis not present

## 2023-11-23 DIAGNOSIS — I255 Ischemic cardiomyopathy: Secondary | ICD-10-CM | POA: Diagnosis not present

## 2023-11-23 DIAGNOSIS — I252 Old myocardial infarction: Secondary | ICD-10-CM | POA: Diagnosis not present

## 2023-11-29 DIAGNOSIS — M48062 Spinal stenosis, lumbar region with neurogenic claudication: Secondary | ICD-10-CM | POA: Diagnosis not present

## 2023-11-29 DIAGNOSIS — M47816 Spondylosis without myelopathy or radiculopathy, lumbar region: Secondary | ICD-10-CM | POA: Diagnosis not present

## 2023-12-03 DIAGNOSIS — I10 Essential (primary) hypertension: Secondary | ICD-10-CM | POA: Diagnosis not present

## 2023-12-03 DIAGNOSIS — F411 Generalized anxiety disorder: Secondary | ICD-10-CM | POA: Diagnosis not present

## 2023-12-03 DIAGNOSIS — F331 Major depressive disorder, recurrent, moderate: Secondary | ICD-10-CM | POA: Diagnosis not present

## 2023-12-03 DIAGNOSIS — D509 Iron deficiency anemia, unspecified: Secondary | ICD-10-CM | POA: Diagnosis not present

## 2023-12-03 DIAGNOSIS — F410 Panic disorder [episodic paroxysmal anxiety] without agoraphobia: Secondary | ICD-10-CM | POA: Diagnosis not present

## 2023-12-03 DIAGNOSIS — I2510 Atherosclerotic heart disease of native coronary artery without angina pectoris: Secondary | ICD-10-CM | POA: Diagnosis not present

## 2023-12-03 DIAGNOSIS — M48061 Spinal stenosis, lumbar region without neurogenic claudication: Secondary | ICD-10-CM | POA: Diagnosis not present

## 2023-12-26 DIAGNOSIS — M48062 Spinal stenosis, lumbar region with neurogenic claudication: Secondary | ICD-10-CM | POA: Diagnosis not present

## 2023-12-26 DIAGNOSIS — M545 Low back pain, unspecified: Secondary | ICD-10-CM | POA: Diagnosis not present

## 2023-12-26 DIAGNOSIS — M47816 Spondylosis without myelopathy or radiculopathy, lumbar region: Secondary | ICD-10-CM | POA: Diagnosis not present

## 2023-12-26 DIAGNOSIS — R55 Syncope and collapse: Secondary | ICD-10-CM | POA: Diagnosis not present

## 2023-12-26 DIAGNOSIS — Z86718 Personal history of other venous thrombosis and embolism: Secondary | ICD-10-CM | POA: Diagnosis not present

## 2023-12-26 DIAGNOSIS — Z7409 Other reduced mobility: Secondary | ICD-10-CM | POA: Diagnosis not present

## 2023-12-26 DIAGNOSIS — G8929 Other chronic pain: Secondary | ICD-10-CM | POA: Diagnosis not present

## 2023-12-26 DIAGNOSIS — Z9889 Other specified postprocedural states: Secondary | ICD-10-CM | POA: Diagnosis not present

## 2023-12-26 DIAGNOSIS — I10 Essential (primary) hypertension: Secondary | ICD-10-CM | POA: Diagnosis not present

## 2023-12-27 DIAGNOSIS — R87610 Atypical squamous cells of undetermined significance on cytologic smear of cervix (ASC-US): Secondary | ICD-10-CM | POA: Diagnosis not present

## 2023-12-27 DIAGNOSIS — Z1151 Encounter for screening for human papillomavirus (HPV): Secondary | ICD-10-CM | POA: Diagnosis not present

## 2023-12-27 DIAGNOSIS — Z01419 Encounter for gynecological examination (general) (routine) without abnormal findings: Secondary | ICD-10-CM | POA: Diagnosis not present

## 2024-01-02 ENCOUNTER — Emergency Department (HOSPITAL_COMMUNITY): Payer: 59

## 2024-01-02 ENCOUNTER — Inpatient Hospital Stay (HOSPITAL_COMMUNITY)
Admission: EM | Admit: 2024-01-02 | Discharge: 2024-01-04 | DRG: 683 | Disposition: A | Discharge status: Home / Self Care | Payer: 59 | Attending: Infectious Diseases | Admitting: Infectious Diseases

## 2024-01-02 ENCOUNTER — Inpatient Hospital Stay (HOSPITAL_COMMUNITY): Payer: 59

## 2024-01-02 ENCOUNTER — Other Ambulatory Visit: Payer: Self-pay

## 2024-01-02 ENCOUNTER — Encounter (HOSPITAL_COMMUNITY): Payer: Self-pay

## 2024-01-02 DIAGNOSIS — I110 Hypertensive heart disease with heart failure: Secondary | ICD-10-CM | POA: Diagnosis not present

## 2024-01-02 DIAGNOSIS — G479 Sleep disorder, unspecified: Secondary | ICD-10-CM | POA: Diagnosis not present

## 2024-01-02 DIAGNOSIS — E871 Hypo-osmolality and hyponatremia: Secondary | ICD-10-CM

## 2024-01-02 DIAGNOSIS — G319 Degenerative disease of nervous system, unspecified: Secondary | ICD-10-CM | POA: Diagnosis not present

## 2024-01-02 DIAGNOSIS — G8929 Other chronic pain: Secondary | ICD-10-CM | POA: Diagnosis not present

## 2024-01-02 DIAGNOSIS — Z6834 Body mass index (BMI) 34.0-34.9, adult: Secondary | ICD-10-CM

## 2024-01-02 DIAGNOSIS — Z86718 Personal history of other venous thrombosis and embolism: Secondary | ICD-10-CM | POA: Diagnosis not present

## 2024-01-02 DIAGNOSIS — E872 Acidosis, unspecified: Secondary | ICD-10-CM | POA: Diagnosis present

## 2024-01-02 DIAGNOSIS — Z96652 Presence of left artificial knee joint: Secondary | ICD-10-CM | POA: Diagnosis present

## 2024-01-02 DIAGNOSIS — I700 Atherosclerosis of aorta: Secondary | ICD-10-CM | POA: Diagnosis not present

## 2024-01-02 DIAGNOSIS — I5022 Chronic systolic (congestive) heart failure: Secondary | ICD-10-CM | POA: Diagnosis not present

## 2024-01-02 DIAGNOSIS — I959 Hypotension, unspecified: Secondary | ICD-10-CM | POA: Diagnosis present

## 2024-01-02 DIAGNOSIS — Z7901 Long term (current) use of anticoagulants: Secondary | ICD-10-CM

## 2024-01-02 DIAGNOSIS — J432 Centrilobular emphysema: Secondary | ICD-10-CM | POA: Diagnosis not present

## 2024-01-02 DIAGNOSIS — R531 Weakness: Secondary | ICD-10-CM | POA: Diagnosis present

## 2024-01-02 DIAGNOSIS — N179 Acute kidney failure, unspecified: Secondary | ICD-10-CM | POA: Diagnosis not present

## 2024-01-02 DIAGNOSIS — Z955 Presence of coronary angioplasty implant and graft: Secondary | ICD-10-CM | POA: Diagnosis not present

## 2024-01-02 DIAGNOSIS — E785 Hyperlipidemia, unspecified: Secondary | ICD-10-CM | POA: Diagnosis present

## 2024-01-02 DIAGNOSIS — I252 Old myocardial infarction: Secondary | ICD-10-CM

## 2024-01-02 DIAGNOSIS — F17210 Nicotine dependence, cigarettes, uncomplicated: Secondary | ICD-10-CM | POA: Diagnosis not present

## 2024-01-02 DIAGNOSIS — N3000 Acute cystitis without hematuria: Secondary | ICD-10-CM | POA: Diagnosis not present

## 2024-01-02 DIAGNOSIS — F419 Anxiety disorder, unspecified: Secondary | ICD-10-CM | POA: Diagnosis not present

## 2024-01-02 DIAGNOSIS — Z7951 Long term (current) use of inhaled steroids: Secondary | ICD-10-CM

## 2024-01-02 DIAGNOSIS — R7989 Other specified abnormal findings of blood chemistry: Secondary | ICD-10-CM | POA: Diagnosis present

## 2024-01-02 DIAGNOSIS — R197 Diarrhea, unspecified: Secondary | ICD-10-CM | POA: Diagnosis not present

## 2024-01-02 DIAGNOSIS — Z79899 Other long term (current) drug therapy: Secondary | ICD-10-CM

## 2024-01-02 DIAGNOSIS — D649 Anemia, unspecified: Secondary | ICD-10-CM | POA: Diagnosis present

## 2024-01-02 DIAGNOSIS — A419 Sepsis, unspecified organism: Secondary | ICD-10-CM | POA: Diagnosis not present

## 2024-01-02 DIAGNOSIS — Z7902 Long term (current) use of antithrombotics/antiplatelets: Secondary | ICD-10-CM

## 2024-01-02 DIAGNOSIS — I517 Cardiomegaly: Secondary | ICD-10-CM | POA: Diagnosis not present

## 2024-01-02 DIAGNOSIS — I739 Peripheral vascular disease, unspecified: Secondary | ICD-10-CM | POA: Diagnosis present

## 2024-01-02 DIAGNOSIS — M4800 Spinal stenosis, site unspecified: Secondary | ICD-10-CM | POA: Diagnosis present

## 2024-01-02 DIAGNOSIS — R296 Repeated falls: Secondary | ICD-10-CM | POA: Diagnosis not present

## 2024-01-02 DIAGNOSIS — E611 Iron deficiency: Secondary | ICD-10-CM | POA: Diagnosis not present

## 2024-01-02 DIAGNOSIS — I2510 Atherosclerotic heart disease of native coronary artery without angina pectoris: Secondary | ICD-10-CM | POA: Diagnosis not present

## 2024-01-02 DIAGNOSIS — K5730 Diverticulosis of large intestine without perforation or abscess without bleeding: Secondary | ICD-10-CM | POA: Diagnosis not present

## 2024-01-02 DIAGNOSIS — S0990XA Unspecified injury of head, initial encounter: Secondary | ICD-10-CM | POA: Diagnosis not present

## 2024-01-02 DIAGNOSIS — N390 Urinary tract infection, site not specified: Secondary | ICD-10-CM | POA: Diagnosis present

## 2024-01-02 DIAGNOSIS — S199XXA Unspecified injury of neck, initial encounter: Secondary | ICD-10-CM | POA: Diagnosis not present

## 2024-01-02 DIAGNOSIS — R29810 Facial weakness: Secondary | ICD-10-CM | POA: Diagnosis not present

## 2024-01-02 DIAGNOSIS — Z7982 Long term (current) use of aspirin: Secondary | ICD-10-CM

## 2024-01-02 DIAGNOSIS — I723 Aneurysm of iliac artery: Secondary | ICD-10-CM | POA: Diagnosis present

## 2024-01-02 DIAGNOSIS — Z743 Need for continuous supervision: Secondary | ICD-10-CM | POA: Diagnosis not present

## 2024-01-02 DIAGNOSIS — F32A Depression, unspecified: Secondary | ICD-10-CM | POA: Diagnosis present

## 2024-01-02 DIAGNOSIS — E669 Obesity, unspecified: Secondary | ICD-10-CM | POA: Diagnosis not present

## 2024-01-02 LAB — URINALYSIS, W/ REFLEX TO CULTURE (INFECTION SUSPECTED)
Bilirubin Urine: NEGATIVE
Glucose, UA: NEGATIVE mg/dL
Ketones, ur: NEGATIVE mg/dL
Nitrite: POSITIVE — AB
Protein, ur: 30 mg/dL — AB
Specific Gravity, Urine: 1.01 (ref 1.005–1.030)
pH: 6 (ref 5.0–8.0)

## 2024-01-02 LAB — I-STAT CHEM 8, ED
BUN: 53 mg/dL — ABNORMAL HIGH (ref 8–23)
Calcium, Ion: 0.97 mmol/L — ABNORMAL LOW (ref 1.15–1.40)
Chloride: 97 mmol/L — ABNORMAL LOW (ref 98–111)
Creatinine, Ser: 3.6 mg/dL — ABNORMAL HIGH (ref 0.44–1.00)
Glucose, Bld: 101 mg/dL — ABNORMAL HIGH (ref 70–99)
HCT: 31 % — ABNORMAL LOW (ref 36.0–46.0)
Hemoglobin: 10.5 g/dL — ABNORMAL LOW (ref 12.0–15.0)
Potassium: 4.5 mmol/L (ref 3.5–5.1)
Sodium: 125 mmol/L — ABNORMAL LOW (ref 135–145)
TCO2: 19 mmol/L — ABNORMAL LOW (ref 22–32)

## 2024-01-02 LAB — I-STAT VENOUS BLOOD GAS, ED
Acid-base deficit: 6 mmol/L — ABNORMAL HIGH (ref 0.0–2.0)
Bicarbonate: 18.1 mmol/L — ABNORMAL LOW (ref 20.0–28.0)
Calcium, Ion: 0.98 mmol/L — ABNORMAL LOW (ref 1.15–1.40)
HCT: 31 % — ABNORMAL LOW (ref 36.0–46.0)
Hemoglobin: 10.5 g/dL — ABNORMAL LOW (ref 12.0–15.0)
O2 Saturation: 51 %
Potassium: 4.4 mmol/L (ref 3.5–5.1)
Sodium: 126 mmol/L — ABNORMAL LOW (ref 135–145)
TCO2: 19 mmol/L — ABNORMAL LOW (ref 22–32)
pCO2, Ven: 29.8 mm[Hg] — ABNORMAL LOW (ref 44–60)
pH, Ven: 7.392 (ref 7.25–7.43)
pO2, Ven: 27 mm[Hg] — CL (ref 32–45)

## 2024-01-02 LAB — CBC WITH DIFFERENTIAL/PLATELET
Abs Immature Granulocytes: 0.05 10*3/uL (ref 0.00–0.07)
Basophils Absolute: 0 10*3/uL (ref 0.0–0.1)
Basophils Relative: 0 %
Eosinophils Absolute: 0.1 10*3/uL (ref 0.0–0.5)
Eosinophils Relative: 1 %
HCT: 30.7 % — ABNORMAL LOW (ref 36.0–46.0)
Hemoglobin: 10.3 g/dL — ABNORMAL LOW (ref 12.0–15.0)
Immature Granulocytes: 1 %
Lymphocytes Relative: 9 %
Lymphs Abs: 0.9 10*3/uL (ref 0.7–4.0)
MCH: 28.5 pg (ref 26.0–34.0)
MCHC: 33.6 g/dL (ref 30.0–36.0)
MCV: 85 fL (ref 80.0–100.0)
Monocytes Absolute: 1 10*3/uL (ref 0.1–1.0)
Monocytes Relative: 10 %
Neutro Abs: 8.4 10*3/uL — ABNORMAL HIGH (ref 1.7–7.7)
Neutrophils Relative %: 79 %
Platelets: 172 10*3/uL (ref 150–400)
RBC: 3.61 MIL/uL — ABNORMAL LOW (ref 3.87–5.11)
RDW: 17.7 % — ABNORMAL HIGH (ref 11.5–15.5)
WBC: 10.5 10*3/uL (ref 4.0–10.5)
nRBC: 0 % (ref 0.0–0.2)

## 2024-01-02 LAB — COMPREHENSIVE METABOLIC PANEL
ALT: 60 U/L — ABNORMAL HIGH (ref 0–44)
AST: 162 U/L — ABNORMAL HIGH (ref 15–41)
Albumin: 2.6 g/dL — ABNORMAL LOW (ref 3.5–5.0)
Alkaline Phosphatase: 61 U/L (ref 38–126)
Anion gap: 15 (ref 5–15)
BUN: 44 mg/dL — ABNORMAL HIGH (ref 8–23)
CO2: 17 mmol/L — ABNORMAL LOW (ref 22–32)
Calcium: 8 mg/dL — ABNORMAL LOW (ref 8.9–10.3)
Chloride: 94 mmol/L — ABNORMAL LOW (ref 98–111)
Creatinine, Ser: 3.27 mg/dL — ABNORMAL HIGH (ref 0.44–1.00)
GFR, Estimated: 15 mL/min — ABNORMAL LOW (ref >60)
Glucose, Bld: 104 mg/dL — ABNORMAL HIGH (ref 70–99)
Potassium: 4.2 mmol/L (ref 3.5–5.1)
Sodium: 126 mmol/L — ABNORMAL LOW (ref 135–145)
Total Bilirubin: 0.7 mg/dL (ref 0.0–1.2)
Total Protein: 5.8 g/dL — ABNORMAL LOW (ref 6.5–8.1)

## 2024-01-02 LAB — BASIC METABOLIC PANEL
Anion gap: 11 (ref 5–15)
BUN: 38 mg/dL — ABNORMAL HIGH (ref 8–23)
CO2: 17 mmol/L — ABNORMAL LOW (ref 22–32)
Calcium: 8 mg/dL — ABNORMAL LOW (ref 8.9–10.3)
Chloride: 102 mmol/L (ref 98–111)
Creatinine, Ser: 2.35 mg/dL — ABNORMAL HIGH (ref 0.44–1.00)
GFR, Estimated: 23 mL/min — ABNORMAL LOW (ref >60)
Glucose, Bld: 112 mg/dL — ABNORMAL HIGH (ref 70–99)
Potassium: 3.9 mmol/L (ref 3.5–5.1)
Sodium: 130 mmol/L — ABNORMAL LOW (ref 135–145)

## 2024-01-02 LAB — C DIFFICILE QUICK SCREEN W PCR REFLEX
C Diff antigen: NEGATIVE
C Diff interpretation: NOT DETECTED
C Diff toxin: NEGATIVE

## 2024-01-02 LAB — RESP PANEL BY RT-PCR (RSV, FLU A&B, COVID)  RVPGX2
Influenza A by PCR: NEGATIVE
Influenza B by PCR: NEGATIVE
Resp Syncytial Virus by PCR: NEGATIVE
SARS Coronavirus 2 by RT PCR: NEGATIVE

## 2024-01-02 LAB — APTT: aPTT: 42 s — ABNORMAL HIGH (ref 24–36)

## 2024-01-02 LAB — I-STAT CG4 LACTIC ACID, ED: Lactic Acid, Venous: 1.3 mmol/L (ref 0.5–1.9)

## 2024-01-02 LAB — TROPONIN I (HIGH SENSITIVITY)
Troponin I (High Sensitivity): 50 ng/L — ABNORMAL HIGH (ref <18)
Troponin I (High Sensitivity): 44 ng/L — ABNORMAL HIGH (ref <18)

## 2024-01-02 LAB — PROTIME-INR
INR: 1.8 — ABNORMAL HIGH (ref 0.8–1.2)
Prothrombin Time: 21.5 s — ABNORMAL HIGH (ref 11.4–15.2)

## 2024-01-02 LAB — LIPASE, BLOOD: Lipase: 22 U/L (ref 11–51)

## 2024-01-02 MED ORDER — ACETAMINOPHEN 325 MG PO TABS: 650.0000 mg | ORAL_TABLET | Freq: Four times a day (QID) | ORAL | Status: DC | PRN

## 2024-01-02 MED ORDER — IPRATROPIUM-ALBUTEROL 0.5-2.5 (3) MG/3ML IN SOLN
3.0000 mL | Freq: Once | RESPIRATORY_TRACT | Status: AC
  Administered 2024-01-02: 3 mL via RESPIRATORY_TRACT
  Filled 2024-01-02: qty 3

## 2024-01-02 MED ORDER — EZETIMIBE 10 MG PO TABS
10.0000 mg | ORAL_TABLET | Freq: Every day | ORAL | Status: DC
  Administered 2024-01-03: 10 mg via ORAL
  Filled 2024-01-02: qty 1

## 2024-01-02 MED ORDER — NOREPINEPHRINE 4 MG/250ML-% IV SOLN: 0.0000 ug/min | INTRAVENOUS | Status: DC

## 2024-01-02 MED ORDER — SODIUM CHLORIDE 0.9 % IV BOLUS (SEPSIS)
1000.0000 mL | Freq: Once | INTRAVENOUS | Status: AC
  Administered 2024-01-02: 1000 mL via INTRAVENOUS

## 2024-01-02 MED ORDER — SODIUM CHLORIDE 0.9 % IV SOLN: Freq: Once | INTRAVENOUS | Status: DC

## 2024-01-02 MED ORDER — COSYNTROPIN 0.25 MG IJ SOLR
0.2500 mg | Freq: Once | INTRAMUSCULAR | Status: AC
  Administered 2024-01-03: 0.25 mg via INTRAVENOUS
  Filled 2024-01-02: qty 0.25

## 2024-01-02 MED ORDER — TICAGRELOR 90 MG PO TABS
90.0000 mg | ORAL_TABLET | Freq: Two times a day (BID) | ORAL | Status: DC
  Administered 2024-01-03 – 2024-01-04 (×4): 90 mg via ORAL
  Filled 2024-01-02 (×5): qty 1

## 2024-01-02 MED ORDER — CARVEDILOL 3.125 MG PO TABS
9.3750 mg | ORAL_TABLET | Freq: Two times a day (BID) | ORAL | Status: DC
Start: 2024-01-02 — End: 2024-01-02

## 2024-01-02 MED ORDER — SODIUM CHLORIDE 0.9 % IV SOLN
1.0000 g | INTRAVENOUS | Status: DC
  Administered 2024-01-02 – 2024-01-03 (×2): 1 g via INTRAVENOUS
  Filled 2024-01-02 (×2): qty 10

## 2024-01-02 MED ORDER — SODIUM CHLORIDE 0.9 % IV SOLN: Freq: Once | INTRAVENOUS | Status: AC

## 2024-01-02 MED ORDER — ATORVASTATIN CALCIUM 80 MG PO TABS
80.0000 mg | ORAL_TABLET | Freq: Every day | ORAL | Status: DC
  Administered 2024-01-03 (×2): 80 mg via ORAL
  Filled 2024-01-02: qty 1
  Filled 2024-01-02: qty 2

## 2024-01-02 MED ORDER — SODIUM CHLORIDE 0.9 % IV BOLUS
500.0000 mL | Freq: Once | INTRAVENOUS | Status: AC
  Administered 2024-01-02: 500 mL via INTRAVENOUS

## 2024-01-02 MED ORDER — ACETAMINOPHEN 650 MG RE SUPP: 650.0000 mg | Freq: Four times a day (QID) | RECTAL | Status: DC | PRN

## 2024-01-02 MED ORDER — VANCOMYCIN HCL IN DEXTROSE 1-5 GM/200ML-% IV SOLN
1000.0000 mg | Freq: Once | INTRAVENOUS | Status: AC
  Administered 2024-01-02: 1000 mg via INTRAVENOUS
  Filled 2024-01-02: qty 200

## 2024-01-02 MED ORDER — APIXABAN 5 MG PO TABS
5.0000 mg | ORAL_TABLET | Freq: Two times a day (BID) | ORAL | Status: DC
  Administered 2024-01-03 – 2024-01-04 (×4): 5 mg via ORAL
  Filled 2024-01-02 (×4): qty 1

## 2024-01-02 MED ORDER — FERROUS SULFATE 325 (65 FE) MG PO TABS
325.0000 mg | ORAL_TABLET | Freq: Every day | ORAL | Status: DC
  Administered 2024-01-03 – 2024-01-04 (×2): 325 mg via ORAL
  Filled 2024-01-02 (×2): qty 1

## 2024-01-02 MED ORDER — METRONIDAZOLE 500 MG/100ML IV SOLN
500.0000 mg | Freq: Once | INTRAVENOUS | Status: AC
  Administered 2024-01-02: 500 mg via INTRAVENOUS
  Filled 2024-01-02: qty 100

## 2024-01-02 MED ORDER — ADULT MULTIVITAMIN W/MINERALS CH
1.0000 | ORAL_TABLET | Freq: Every day | ORAL | Status: DC
  Administered 2024-01-03 – 2024-01-04 (×2): 1 via ORAL
  Filled 2024-01-02 (×2): qty 1

## 2024-01-02 MED ORDER — ALBUTEROL SULFATE (2.5 MG/3ML) 0.083% IN NEBU
2.5000 mg | INHALATION_SOLUTION | RESPIRATORY_TRACT | Status: DC | PRN
  Administered 2024-01-03: 2.5 mg via RESPIRATORY_TRACT
  Filled 2024-01-02: qty 3

## 2024-01-02 MED ORDER — SODIUM CHLORIDE 0.9 % IV SOLN
2.0000 g | Freq: Once | INTRAVENOUS | Status: AC
  Administered 2024-01-02: 2 g via INTRAVENOUS
  Filled 2024-01-02: qty 12.5

## 2024-01-02 MED ORDER — SODIUM CHLORIDE 0.9 % IV BOLUS
1000.0000 mL | Freq: Once | INTRAVENOUS | Status: AC
  Administered 2024-01-02: 1000 mL via INTRAVENOUS

## 2024-01-02 MED ORDER — VENLAFAXINE HCL ER 75 MG PO CP24
75.0000 mg | ORAL_CAPSULE | Freq: Every day | ORAL | Status: DC
  Administered 2024-01-03 – 2024-01-04 (×2): 75 mg via ORAL
  Filled 2024-01-02 (×2): qty 1

## 2024-01-02 MED ORDER — LACTATED RINGERS IV BOLUS
1000.0000 mL | Freq: Once | INTRAVENOUS | Status: AC
  Administered 2024-01-03: 1000 mL via INTRAVENOUS

## 2024-01-02 MED ORDER — SODIUM CHLORIDE 0.9 % IV BOLUS (SEPSIS): 2000.0000 mL | Freq: Once | INTRAVENOUS | Status: DC

## 2024-01-02 MED ORDER — MELATONIN 5 MG PO TABS: 5.0000 mg | ORAL_TABLET | Freq: Every evening | ORAL | Status: DC | PRN

## 2024-01-02 NOTE — Progress Notes (Signed)
 Elink following for sepsis protocol.

## 2024-01-02 NOTE — ED Triage Notes (Signed)
 Right facial droop with bp of 80/40 from home. 850cc bolus given by EMS. PT also reports 4 falls yesterday. Also reports 4 days of diarrhea episodes, headache, sob. Alert and oriented x 4.

## 2024-01-02 NOTE — ED Notes (Signed)
 Patient transported to MRI

## 2024-01-02 NOTE — ED Provider Notes (Signed)
 [facility] Provider Note   CSN: [id number] Arrival date & time: 01/02/24  1022     History  Chief Complaint  Patient presents with   Neurologic Problem    Rachel Chen is a 63 y.o. female.   Neurologic Problem   63 year old female history of CAD, prior DVT, hypertension, peripheral vascular disease presenting for multiple concerns.  Patient states she had a cold several days ago.  For the last 4 days she has had diffuse watery diarrhea without blood or melena.  She has had some shortness of breath, chest pain and bodyaches all over.  No nausea or vomiting.  No fevers or chills as far she is aware.  She has had multiple falls and she is unsure why.  She states for the last 48 hours she is felt weak and numb on the right side.  EMS was concerned for possible right-sided facial droop.  She states she has had no change in symptoms for at least 48 hours.  No difficulty with speech.  Unsure if she hit her head or had any other injuries when she fell.       Home Medications Prior to Admission medications   Medication Sig Start Date End Date Taking? Authorizing Provider  albuterol  (VENTOLIN  HFA) 108 (90 Base) MCG/ACT inhaler Inhale 2 puffs into the lungs every 6 (six) hours as needed for shortness of breath or wheezing. 11/23/23  Yes [provider]  ALPRAZolam  (XANAX ) 1 MG tablet Take 1 mg by mouth 3 (three) times daily as needed for anxiety.   Yes [provider]  amitriptyline  (ELAVIL ) 25 MG tablet Take 25 mg by mouth at bedtime as needed for sleep.   Yes [provider]  ARIPiprazole  (ABILIFY ) 2 MG tablet Take 2 mg by mouth in the morning. 12/27/18  Yes [provider]  atorvastatin  (LIPITOR) 80 MG tablet Take 80 mg by mouth at bedtime. 06/28/23  Yes [provider]  BRILINTA  90 MG TABS tablet Take 90 mg by mouth 2 (two) times daily.   Yes [provider]  budesonide-formoterol  (SYMBICORT) 160-4.5 MCG/ACT inhaler Inhale 2 puffs into the lungs daily as needed. 12/03/23 12/02/24 Yes [provider]  carvedilol  (COREG ) 6.25 MG tablet Take 9.375 mg by mouth 2 (two) times daily. 07/24/18  Yes [provider]  DM-Doxylamine-Acetaminophen  (NYQUIL COLD & FLU PO) Take 30 mLs by mouth daily as needed.   Yes [provider]  ELIQUIS  5 MG TABS tablet Take 5 mg by mouth 2 (two) times daily. 10/16/23  Yes [provider]  ezetimibe  (ZETIA ) 10 MG tablet Take 10 mg by mouth at bedtime.   Yes [provider]  ferrous sulfate  325 (65 FE) MG EC tablet Take 325 mg by mouth daily with breakfast.   Yes [provider]  furosemide  (LASIX ) 40 MG tablet Take 20 mg by mouth 2 (two) times daily.   Yes [provider]  gabapentin  (NEURONTIN ) 600 MG tablet Take 600 mg by mouth 3 (three) times daily. 07/29/18  Yes [provider]  lisinopril  (PRINIVIL ,ZESTRIL ) 40 MG tablet Take 40 mg by mouth in the morning.   Yes [provider]  Multiple Vitamins-Minerals (PRESERVISION AREDS 2+MULTI VIT PO) Take 1 capsule by mouth in the morning and at bedtime.   Yes [provider]  spironolactone (ALDACTONE) 50 MG tablet Take 50 mg by mouth in the morning.   Yes [provider]  venlafaxine  XR (EFFEXOR -XR) 75  MG 24 hr capsule Take 75 mg by mouth in the morning, at noon, and at bedtime.   Yes [provider]      Allergies    Patient has no known allergies.    Review of Systems   Review of Systems. Review of systems completed and notable as per HPI.  ROS otherwise negative.   Physical Exam Updated Vital Signs BP (!) 103/51   Pulse 71   Temp (S) 100 F (37.8 C) (Rectal)   Resp (!) 25   Ht 5\' 5"  (1.651 m)   Wt 94.8 kg   SpO2 97%   BMI 34.78 kg/m  Physical Exam Vitals and nursing note reviewed.  Constitutional:      General: She is not in acute distress.    Appearance: She is well-developed. She  is obese. She is ill-appearing.  HENT:     Head: Normocephalic and atraumatic.     Nose: Nose normal.     Mouth/Throat:     Mouth: Mucous membranes are dry.     Pharynx: Oropharynx is clear.  Eyes:     Extraocular Movements: Extraocular movements intact.     Conjunctiva/sclera: Conjunctivae normal.     Pupils: Pupils are equal, round, and reactive to light.  Cardiovascular:     Rate and Rhythm: Normal rate and regular rhythm.     Pulses: Normal pulses.     Heart sounds: Normal heart sounds. No murmur heard. Pulmonary:     Effort: Pulmonary effort is normal. No respiratory distress.     Breath sounds: Normal breath sounds.  Abdominal:     Palpations: Abdomen is soft.     Tenderness: There is abdominal tenderness. There is no guarding or rebound.  Musculoskeletal:        General: No swelling.     Cervical back: Normal range of motion and neck supple. No rigidity or tenderness.     Right lower leg: No edema.     Left lower leg: No edema.  Skin:    General: Skin is warm and dry.     Capillary Refill: Capillary refill takes less than 2 seconds.  Neurological:     Mental Status: She is alert and oriented to person, place, and time.     Cranial Nerves: No cranial nerve deficit.     Motor: No weakness.     Comments: Patient is awake and alert.  Oriented person, time, situation.  Normal speech.  She has no facial droop or asymmetry.  Cranial nerves and extraocular movements are intact.  No visual field cuts.  No neglect.  She is able to move the left oxygen was antigravity without drift or weakness.  She has subjective decrease sensation on the right arm and leg.  Normal facial sensation.  She has palpable DP and PT pulse on the right and PT pulse and Doppler signal on the left.  Difficult to palpate DP.  Her extremities are warm and well-perfused.  Psychiatric:        Mood and Affect: Mood normal.     ED Results / Procedures / Treatments   Labs (all labs ordered are listed, but only  abnormal results are displayed) Labs Reviewed  COMPREHENSIVE METABOLIC PANEL - Abnormal; Notable for the following components:      Result Value   Sodium 126 (*)    Chloride 94 (*)    CO2 17 (*)    Glucose, Bld 104 (*)    BUN 44 (*)    Creatinine,  Ser 3.27 (*)    Calcium  8.0 (*)    Total Protein 5.8 (*)    Albumin 2.6 (*)    AST 162 (*)    ALT 60 (*)    GFR, Estimated 15 (*)    All other components within normal limits  CBC WITH DIFFERENTIAL/PLATELET - Abnormal; Notable for the following components:   RBC 3.61 (*)    Hemoglobin 10.3 (*)    HCT 30.7 (*)    RDW 17.7 (*)    Neutro Abs 8.4 (*)    All other components within normal limits  PROTIME-INR - Abnormal; Notable for the following components:   Prothrombin Time 21.5 (*)    INR 1.8 (*)    All other components within normal limits  APTT - Abnormal; Notable for the following components:   aPTT 42 (*)    All other components within normal limits  URINALYSIS, W/ REFLEX TO CULTURE (INFECTION SUSPECTED) - Abnormal; Notable for the following components:   APPearance CLOUDY (*)    Hgb urine dipstick LARGE (*)    Protein, ur 30 (*)    Nitrite POSITIVE (*)    Leukocytes,Ua MODERATE (*)    Bacteria, UA FEW (*)    All other components within normal limits  I-STAT CHEM 8, ED - Abnormal; Notable for the following components:   Sodium 125 (*)    Chloride 97 (*)    BUN 53 (*)    Creatinine, Ser 3.60 (*)    Glucose, Bld 101 (*)    Calcium , Ion 0.97 (*)    TCO2 19 (*)    Hemoglobin 10.5 (*)    HCT 31.0 (*)    All other components within normal limits  I-STAT VENOUS BLOOD GAS, ED - Abnormal; Notable for the following components:   pCO2, Ven 29.8 (*)    pO2, Ven 27 (*)    Bicarbonate 18.1 (*)    TCO2 19 (*)    Acid-base deficit 6.0 (*)    Sodium 126 (*)    Calcium , Ion 0.98 (*)    HCT 31.0 (*)    Hemoglobin 10.5 (*)    All other components within normal limits  TROPONIN I (HIGH SENSITIVITY) - Abnormal; Notable for the  following components:   Troponin I (High Sensitivity) 50 (*)    All other components within normal limits  TROPONIN I (HIGH SENSITIVITY) - Abnormal; Notable for the following components:   Troponin I (High Sensitivity) 44 (*)    All other components within normal limits  RESP PANEL BY RT-PCR (RSV, FLU A&B, COVID)  [id number]  CULTURE, BLOOD (ROUTINE X 2)  CULTURE, BLOOD (ROUTINE X 2)  C DIFFICILE QUICK SCREEN W PCR REFLEX    GASTROINTESTINAL PANEL BY PCR, STOOL (REPLACES STOOL CULTURE)  URINE CULTURE  LIPASE, BLOOD  BASIC METABOLIC PANEL  I-STAT CG4 LACTIC ACID, ED    EKG EKG Interpretation Date/Time:  Wednesday January 02 2024 10:37:23 EST Ventricular Rate:  66 PR Interval:  186 QRS Duration:  104 QT Interval:  397 QTC Calculation: 416 R Axis:   67  Text Interpretation: Sinus rhythm Consider left atrial enlargement Anterior infarct, old Confirmed by Rueben Cote [phone]) on 01/02/2024 11:01:25 AM  Radiology CT CHEST ABDOMEN PELVIS WO CONTRAST Result Date: 01/02/2024 CLINICAL DATA:  History of multiple falls.  Concern for sepsis. EXAM: CT CHEST, ABDOMEN AND PELVIS WITHOUT CONTRAST TECHNIQUE: Multidetector CT imaging of the chest, abdomen and pelvis was performed following the standard protocol without IV contrast. RADIATION DOSE REDUCTION:  This exam was performed according to the departmental dose-optimization program which includes automated exposure control, adjustment of the mA and/or kV according to patient size and/or use of iterative reconstruction technique. COMPARISON:  CT chest dated March 16, 2019. CT abdomen/pelvis dated November 15, 2022. FINDINGS: CT CHEST FINDINGS Cardiovascular: The thoracic aorta is normal in caliber. Atherosclerotic calcification of the thoracic aorta and arch branch vessels. Borderline cardiomegaly. Multivessel coronary artery calcifications. No pericardial effusion. Mediastinum/Nodes: No enlarged mediastinal, hilar, or axillary lymph nodes. Thyroid   gland and trachea are grossly unremarkable. Similar moderate-to-large sliding-type hiatal hernia is again noted. Lungs/Pleura: Mild upper lobe predominant centrilobular emphysema. Linear atelectasis/scarring in the right middle lobe. Mild bibasilar linear atelectasis/scarring. No suspicious pulmonary nodule. No focal consolidation, pleural effusion, or pneumothorax. Musculoskeletal: Severe degenerative changes of the right glenohumeral joint with joint space narrowing and marginal osteophytosis. There is a right glenohumeral joint effusion with lobulated fluid collections insinuating from the joint space (series 2, image 18). Remote healed right posterior fifth rib fracture. Remote healed nonunited right lateral eighth and ninth rib fractures. Remote healed left posterior tenth rib fracture. CT ABDOMEN PELVIS FINDINGS Hepatobiliary: No focal liver abnormality is seen. No gallstones, gallbladder wall thickening, or biliary dilatation. Pancreas: Unremarkable. No pancreatic ductal dilatation or surrounding inflammatory changes. Spleen: Normal in size without focal abnormality. Adrenals/Urinary Tract: Stable benign left adrenal adenoma, for which no follow-up imaging is needed. The right adrenal gland is unremarkable. Similar right renal cortical scarring. No renal calculi or hydronephrosis. Bladder is unremarkable. Stomach/Bowel: Moderate-to-large sliding-type hiatal hernia, similar to the prior exam. Appendix appears normal. No evidence of bowel wall thickening, distention, or inflammatory changes. Colonic diverticulosis without evidence of acute diverticulitis. Vascular/Lymphatic: The abdominal aorta is normal in caliber. Aortic atherosclerosis. 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 No enlarged abdominopelvic lymph nodes. Reproductive: Uterus and bilateral adnexa are unremarkable. Other: No abdominopelvic ascites. No  intraperitoneal free air. Again noted is a fat containing supraumbilical ventral abdominal wall hernia with the abdominal wall defect measuring approximately 3.8 cm. Additional lower fat containing ventral abdominal wall hernia. Musculoskeletal: No acute osseous abnormality. No suspicious osseous lesion. Similar serpiginous sclerotic change at the right femoral head, suggestive of avascular necrosis. No definite evidence of subchondral collapse. Multilevel degenerative changes of the spine, most pronounced at the lumbar spine with similar grade 1 anterolisthesis of L4-L5. IMPRESSION: 1. No acute localizing findings in the chest, abdomen, or pelvis. 2. Moderate-to-large hiatal hernia, similar to the prior exam. 3. Colonic diverticulosis without evidence of acute diverticulitis. 4. 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 This is not significantly changed since the prior exam. Recommend nonemergent vascular surgery consultation and/or follow-up imaging as indicated. 5. Severe degenerative changes of the right glenohumeral joint with joint effusion and lobulated fluid collection insinuating from the joint space. 6. Additional unchanged ancillary findings, as described above. Aortic Atherosclerosis (ICD10-I70.0). Electronically Signed   By: Mannie Seek M.D.   On: 01/02/2024 13:20   DG Chest Port 1 View Result Date: 01/02/2024 CLINICAL DATA:  Concern for sepsis.  Hypotension. EXAM: PORTABLE CHEST 1 VIEW COMPARISON:  Chest radiograph dated August 13, 2023. FINDINGS: Mild cardiomegaly, unchanged. Aortic atherosclerosis. Linear atelectasis/scarring at the mid right lung. No focal consolidation, sizeable pleural effusion, or pneumothorax. No acute osseous abnormality. IMPRESSION: 1. No acute cardiopulmonary findings. 2. Mild cardiomegaly. Electronically Signed   By: Mannie Seek M.D.   On:  01/02/2024 12:46   CT Head Wo Contrast Result Date: 01/02/2024 CLINICAL DATA:   Head trauma, moderate-severe; Neck trauma, dangerous injury mechanism (Age 57-64y) EXAM: CT HEAD WITHOUT CONTRAST CT CERVICAL SPINE WITHOUT CONTRAST TECHNIQUE: Multidetector CT imaging of the head and cervical spine was performed following the standard protocol without intravenous contrast. Multiplanar CT image reconstructions of the cervical spine were also generated. RADIATION DOSE REDUCTION: This exam was performed according to the departmental dose-optimization program which includes automated exposure control, adjustment of the mA and/or kV according to patient size and/or use of iterative reconstruction technique. COMPARISON:  CT head and C Spine 08/13/23 FINDINGS: CT HEAD FINDINGS Brain: No evidence of acute infarction, hemorrhage, hydrocephalus, extra-axial collection or mass lesion/mass effect. Vascular: No hyperdense vessel or unexpected calcification. Skull: Normal. Negative for fracture or focal lesion. Sinuses/Orbits: No middle ear or mastoid effusion. Paranasal sinuses are clear. Orbits are unremarkable. Other: None. CT CERVICAL SPINE FINDINGS Alignment: Trace anterolisthesis of C4 on C5 Skull base and vertebrae: No acute fracture. No primary bone lesion or focal pathologic process. Soft tissues and spinal canal: No prevertebral fluid or swelling. No visible canal hematoma. Disc levels:  No CT evidence of high-grade spinal canal stenosis Upper chest: Negative. Other: None IMPRESSION: 1. No acute intracranial abnormality. 2. No acute fracture or traumatic subluxation of the cervical spine. Electronically Signed   By: Clora Dane M.D.   On: 01/02/2024 12:16   CT Cervical Spine Wo Contrast Result Date: 01/02/2024 CLINICAL DATA:  Head trauma, moderate-severe; Neck trauma, dangerous injury mechanism (Age 58-64y) EXAM: CT HEAD WITHOUT CONTRAST CT CERVICAL SPINE WITHOUT CONTRAST TECHNIQUE: Multidetector CT imaging of the head and cervical spine was performed following the standard protocol without  intravenous contrast. Multiplanar CT image reconstructions of the cervical spine were also generated. RADIATION DOSE REDUCTION: This exam was performed according to the departmental dose-optimization program which includes automated exposure control, adjustment of the mA and/or kV according to patient size and/or use of iterative reconstruction technique. COMPARISON:  CT head and C Spine 08/13/23 FINDINGS: CT HEAD FINDINGS Brain: No evidence of acute infarction, hemorrhage, hydrocephalus, extra-axial collection or mass lesion/mass effect. Vascular: No hyperdense vessel or unexpected calcification. Skull: Normal. Negative for fracture or focal lesion. Sinuses/Orbits: No middle ear or mastoid effusion. Paranasal sinuses are clear. Orbits are unremarkable. Other: None. CT CERVICAL SPINE FINDINGS Alignment: Trace anterolisthesis of C4 on C5 Skull base and vertebrae: No acute fracture. No primary bone lesion or focal pathologic process. Soft tissues and spinal canal: No prevertebral fluid or swelling. No visible canal hematoma. Disc levels:  No CT evidence of high-grade spinal canal stenosis Upper chest: Negative. Other: None IMPRESSION: 1. No acute intracranial abnormality. 2. No acute fracture or traumatic subluxation of the cervical spine. Electronically Signed   By: Clora Dane M.D.   On: 01/02/2024 12:16    Procedures .Critical Care  Performed by: Coleman Daughters, MD Authorized by: Coleman Daughters, MD   Critical care provider statement:    Critical care time (minutes):  30   Critical care time was exclusive of:  Separately billable procedures and treating other patients   Critical care was necessary to treat or prevent imminent or life-threatening deterioration of the following conditions:  Sepsis   Critical care was time spent personally by me on the following activities:  Development of treatment plan with patient or surrogate, discussions with consultants, evaluation of patient's response to  treatment, examination of patient, ordering and review of laboratory studies, ordering and review  of radiographic studies, ordering and performing treatments and interventions, pulse oximetry, re-evaluation of patient's condition and review of old charts   Care discussed with: admitting provider       Medications Ordered in ED Medications  sodium chloride  0.9 % bolus 1,000 mL (0 mLs Intravenous Stopped 01/02/24 1101)  ceFEPIme  (MAXIPIME ) 2 g in sodium chloride  0.9 % 100 mL IVPB (0 g Intravenous Stopped 01/02/24 1203)  metroNIDAZOLE  (FLAGYL ) IVPB 500 mg (0 mg Intravenous Stopped 01/02/24 1226)  vancomycin  (VANCOCIN ) IVPB 1000 mg/200 mL premix (0 mg Intravenous Stopped 01/02/24 1227)  sodium chloride  0.9 % bolus 1,000 mL (0 mLs Intravenous Stopped 01/02/24 1226)  ipratropium-albuterol  (DUONEB) 0.5-2.5 (3) MG/3ML nebulizer solution 3 mL (3 mLs Nebulization Given 01/02/24 1253)  sodium chloride  0.9 % bolus 500 mL (0 mLs Intravenous Stopped 01/02/24 1402)  0.9 %  sodium chloride  infusion ( Intravenous New Bag/Given 01/02/24 1404)    ED Course/ Medical Decision Making/ A&P Clinical Course as of 01/02/24 1500  Wed Jan 02, 2024  1452 Discussed with internal medicine [JD]    Clinical Course User Index [JD] Coleman Daughters, MD                                 Medical Decision Making Amount and/or Complexity of Data Reviewed Labs: ordered. Radiology: ordered.  Risk Prescription drug management. Decision regarding hospitalization.   Medical Decision Making:   Rachel Chen is a 63 y.o. female who presented to the ED today with recent falls, diarrhea, right-sided weakness.  She arrives hypotensive, borderline febrile.  Rectal temperature ordered.  She is not tachycardic but is beta blocked.  She is also anticoagulated.  She reports multiple recent falls, evaluate for trauma.  She also endorses 48 hours of right sided subjective weakness although objectively does not have any deficits including no  facial droop or weakness.  She is outside of TNK window and does not meet LVO criteria.  I am concerned she may be septic given fever, diarrhea, abdominal pain.  She has peripheral vascular disease and DP pulses difficult palpate on left but she has good PT signal and pulse in the extremities are perfused.  I do not think she has critical limb ischemia.  Sepsis protocol initiated.   Patient placed on continuous vitals and telemetry monitoring while in ED which was reviewed periodically.  Reviewed and confirmed nursing documentation for past medical history, family history, social history.  Reassessment and Plan:   Patient initially improved with fluids and then pressure dropped again.  I gave her some additional fluids, total for over 3 L and she has had improvement in blood pressure.  IS normal, MAP is above 65 I do not think she needs pressors at this time.  Her urinalysis concerning for possible infection.  Labs notable for AKI, hyponatremia, mild acidosis.  Troponin mildly elevated likely demand.  CT scan and x-rays without acute findings.  I suspect she has severe dehydration from gastroenteritis and possible UTI.  She has received antibiotics.  Discussed with internal medicine and plan for admission.   Patient's presentation is most consistent with acute presentation with potential threat to life or bodily function.           Final Clinical Impression(s) / ED Diagnoses Final diagnoses:  Sepsis, due to unspecified organism, unspecified whether acute organ dysfunction present (HCC)  AKI (acute kidney injury) (HCC)    Rx / DC Orders ED Discharge  Orders     None         Coleman Daughters, MD 01/02/24 1500

## 2024-01-02 NOTE — ED Notes (Signed)
 Dr.Davis at bedside. Aware of pt bp. 1L NS initiated.

## 2024-01-02 NOTE — H&P (Addendum)
Date: 01/02/2024               Patient Name:  Rachel Chen MRN: [id number]  DOB: 02-Jan-1961 Age / Sex: 63 y.o., female   PCP: Philemon Kingdom, MD         Medical Service: Internal Medicine Teaching Service         Attending Physician: Dr. Ginnie Smart, MD      First Contact: Dr. Carmina Miller, DO Pager [phone]    Second Contact: Dr. Gwenevere Abbot, MD Pager [phone]         After Hours (After 5p/  First Contact Pager: [phone]  weekends / holidays): Second Contact Pager: [phone]   SUBJECTIVE   Chief Complaint: Falls  History of Present Illness: Yanexi Dougal.C[place]more is a 63 year old female with past medical history of STEMI s/p manual thrombectomy and DES of the mid LAD (02/2023), HFrEF (40-45% 02/2023), multiple DVT's, hypertension, HLD, anxiety, depression, chronic back pain, and obesity who presents to the ED after calling EMS for recurrent falls.   Patient states that on Sunday she began to experience aches, cough, congestion.  2 weeks ago she saw PCP for URI and was given a course of amoxicillin which she completed.  Symptoms resolved for 1 week and returned on Sunday.  On Monday she started having diarrhea (4 watery bowel movements with some form) and felt unsteady on her feet.  Yesterday she was getting up from the commode and fell.  She was too weak to get up so she laid there for approximately 2 hours until getting enough strength to crawl to her phone and call her son who then came 30 minutes later to help her up.  Later that afternoon she fell again but was able to get up.  That evening she was in the shower and was reaching for the showerhead when she fell again.  This morning she fell as she was getting up out of bed and was too weak to get up.  She laid on the ground and fell asleep for approximately 6 hours and was able to get up after waking.  Denies hitting head with all falls. Patient endorses frontal bandlike headache that began this morning, chills, exertional dyspnea,  intermittent abdominal cramping, dysuria, right-sided weakness since Monday.  Respiratory symptoms have largely improved.  Patient had 4 watery bowel movements Monday and Tuesday but has not had any today.  Endorsing poor p.o. intake for the past 4 days 2/2 reduced appetite. Denies sick contacts, syncope/presyncope, visual changes, chest pain/palpitations.  Per EMS, patient had right-sided facial droop, hypotensive at 80/40 and was given 850 cc bolus.  On ED arrival patient hypotensive at 87/50 and was given additional bolus (approximately 3 L total) which normalized blood pressure. Sepsis protocol was activated and patient was started on IV Vancomycin, Cefepime, Flagyl.    Medications:  Current Outpatient Medications  Medication Instructions   ARIPiprazole (ABILIFY) 2 mg, Oral, Every morning   atorvastatin (LIPITOR) 80 mg, Oral, Daily at bedtime   Brilinta 90 mg, Oral, 2 times daily   carvedilol (COREG) 9.375 mg, Oral, 2 times daily   Eliquis 5 mg, Oral, 2 times daily   ezetimibe (ZETIA) 10 mg, Daily at bedtime   ferrous sulfate 325 mg, Daily with breakfast   furosemide (LASIX) 20 mg, 2 times daily   lisinopril (ZESTRIL) 40 mg, Every morning   spironolactone (ALDACTONE) 50 mg, Every morning   venlafaxine XR (EFFEXOR-XR) 75 mg, 3 times daily    Past Medical  History Past Medical History:  Diagnosis Date   Anxiety    Arthritis    Coronary artery disease    Depression    DVT (deep venous thrombosis) (HCC)    H/O endarterectomy    aorta - 09/01/21   Heart failure (HCC)    Heart murmur    Hypertension    Myocardial infarct (HCC)    Peripheral vascular disease (HCC)    aorta iliac disease   PONV (postoperative nausea and vomiting)     Past Surgical History Past Surgical History:  Procedure Laterality Date   ANTERIOR AND POSTERIOR VAGINAL REPAIR     A&P repair with mesh, mid urethral sling   AORTIC ENDARTERECETOMY  09/01/2021   BACK SURGERY     BREAST LUMPECTOMY Right     CESAREAN SECTION     x1   HEMORROIDECTOMY  2020   LOWER EXTREMITY ANGIOGRAPHY N/A 08/26/2018   Procedure: LOWER EXTREMITY ANGIOGRAPHY;  Surgeon: Maeola Harman, MD;  Location[facility] INVASIVE CV LAB;  Service: Cardiovascular;  Laterality: N/A;   PERIPHERAL VASCULAR INTERVENTION Right 08/26/2018   Procedure: PERIPHERAL VASCULAR INTERVENTION;  Surgeon: Maeola Harman, MD;  Location: [facility] INVASIVE CV LAB;  Service: Cardiovascular;  Laterality: Right;  Common iliac   right knee arthroscopy      SALPINGOOPHORECTOMY Left    ectopic pregnancy   TENOPLASTY HAMSTRING / FEMUR Right    TOTAL KNEE ARTHROPLASTY Left 10/24/2021   Procedure: TOTAL KNEE ARTHROPLASTY;  Surgeon: Dannielle Huh, MD;  Location: [facility] ORS;  Service: Orthopedics;  Laterality: Left;   TUBAL LIGATION Bilateral    VASCULAR SURGERY  2019   fem-fem BPG    Social:  Lives: Alone at home Level of Function: independent PCP: Philemon Kingdom, MD Substances: Currently smokes a few cigarettes/day but has approximately 40-pack-year history.  Denies alcohol/illegal drug use  Family History:  Family History  Problem Relation Age of Onset   Breast cancer Neg Hx    Ovarian cancer Neg Hx    Endometrial cancer Neg Hx    Colon cancer Neg Hx     Allergies: Allergies as of 01/02/2024   (No Known Allergies)    Review of Systems: A complete ROS was negative except as per HPI.   OBJECTIVE:   Physical Exam: Blood pressure (!) 112/56, pulse 71, temperature (S) 100 F (37.8 C), temperature source (S) Rectal, resp. rate (!) 24, height 5\' 5"  (1.651 m), weight 94.8 kg, SpO2 99%.  Constitutional: obese, lying in bed, NAD HENT: normocephalic atraumatic, mucous membranes moist Eyes: conjunctiva non-erythematous Neck: supple Cardiovascular: regular rate and rhythm, no m/r/g, no LEE Pulmonary/Chest: normal work of breathing on 5 L [place], inspiratory rhonchi bilaterally Abdominal: soft, non-tender, non-distended Neurological:  alert & oriented, CN II-VII intact, 2/5 right-sided U/LE strength, 5/5 left-sided U/LE strength Skin: warm and dry   Labs: CBC    Component Value Date/Time   WBC 10.5 01/02/2024 1106   RBC 3.61 (L) 01/02/2024 1106   HGB 10.5 (L) 01/02/2024 1115   HCT 31.0 (L) 01/02/2024 1115   PLT 172 01/02/2024 1106   MCV 85.0 01/02/2024 1106   MCH 28.5 01/02/2024 1106   MCHC 33.6 01/02/2024 1106   RDW 17.7 (H) 01/02/2024 1106   LYMPHSABS 0.9 01/02/2024 1106   MONOABS 1.0 01/02/2024 1106   EOSABS 0.1 01/02/2024 1106   BASOSABS 0.0 01/02/2024 1106     CMP     Component Value Date/Time   NA 130 (L) 01/02/2024 1500   K 3.9 01/02/2024 1500  CL 102 01/02/2024 1500   CO2 17 (L) 01/02/2024 1500   GLUCOSE 112 (H) 01/02/2024 1500   BUN 38 (H) 01/02/2024 1500   CREATININE 2.35 (H) 01/02/2024 1500   CALCIUM 8.0 (L) 01/02/2024 1500   PROT 5.8 (L) 01/02/2024 1106   ALBUMIN 2.6 (L) 01/02/2024 1106   AST 162 (H) 01/02/2024 1106   ALT 60 (H) 01/02/2024 1106   ALKPHOS 61 01/02/2024 1106   BILITOT 0.7 01/02/2024 1106   GFRNONAA 23 (L) 01/02/2024 1500   GFRAA 74 (L) 10/13/2013 1455    EKG: personally reviewed my interpretation is NSR  ASSESSMENT & PLAN:   Assessment & Plan by Problem: Principal Problem:   Sepsis (HCC) Active Problems:   Hyponatremia   KALIJAH JARECKI is a 63 y.o. female with PMH of STEMI s/p manual thrombectomy and DES of the mid LAD (02/2023), HFrEF (40-45% 02/2023), multiple DVT's, hypertension, HLD, anxiety, depression, chronic back pain, and obesity who presents to the ED after calling EMS for recurrent falls and admitted for severe sepsis with AKI.  #Severe Sepsis #UTI Patient hypotensive at 80/50 and tachypneic in mid 20s on ED arrival.  Hypotension normalize after receiving approximately 3 L NS. Afebrile and no leukocytosis.  Lactic acid wnl. UA shows nitrite positive bacteruria, large hemoglobin, proteinuria.  Culture pending.  Patient endorses dysuria for the past few  days.  No prior history with recurrent UTIs and has never been hospitalized 2/2 UTIs.  CTAP shows no evidence of pyelonephritis. Patient has had diarrhea for the past few days and recently finished course of amoxicillin.  No prior history with persistent diarrhea.  Respiratory symptoms have improved.  COVID/flu negative. No sick contacts. Patient initially started on IV cefepime, vancomycin, Flagyl but given improvement in blood pressure and overall patient presentation, these will be discontinued and patient to start on Rocephin. -Start IV Rocephin 1 g/day with plan for 5 days (starting tomorrow which will be day 2) -BC pending -C. difficile and GI panel ordered, pending -Follow-up urine culture   #AKI Creatinine on admission is 3.27 with baseline ~ 0.8.  Etiology is most likely a mixed picture in the setting of hypotension and UTI.  No casts seen on UA.  Patient has received approximately 3 L NS since admission. -UTI treatment per above -Continue normal saline at 100 cc/hour -Trend BMP -Avoid nephrotoxic agents if possible  #Hyponatremia Sodium was 126 on admission.  Repeat BMP after fluids was 130.  This is most likely attributed to poor p.o. intake in days leading up to admission.  Anticipate improvement with fluids and p.o. intake which patient states is improving. -Trend BMP  #Right-sided weakness Patient endorses right sided weakness for the past few days.  No prior history with this.  EMS noted right-sided facial droop but this was not appreciated on physical exam. Patient outside TNK. CT head showed no evidence of bleed.  CT C-spine unremarkable. -MR brain ordered, pending -PT/OT consult  #Recurrent Falls Patient with 4 falls since Monday.  Denies prodrome leading up to falls.  Denies syncope/presyncope.  EKG unremarkable. Patient with right-sided weakness as noted as above but will investigate other potential causes. -Cosyntropin stimulation test -Continuous  telemetry  #Elevated Troponin 50 on admission with repeat being 44.  Suspect demand ischemia and reduced clearance in the setting of AKI.  Patient denies chest pain and EKG unremarkable.  #Chronic Normocytic Anemia Hemoglobin 10.3 on admission which is at or better than baseline.  Patient does have a history of iron deficiency  for which she takes iron supplements.  Recent iron panel unremarkable. -Trend CBC -Continue home iron  #Headache Endorsing frontal bandlike headache began this morning.  Imaging unremarkable thus far.  MR brain pending.  -Tylenol as needed  #Aneurysmal dilatation of the right common iliac artery Noted on CTAP. Measuring 1.8 cm in diameter. This is not significantly changed since the prior exam.  -Vascular surgery consultation outpatient  Chronic Conditions  #Hypertension Holding home Coreg, Lasix, lisinopril, and Aldactone in the setting of hypotension and AKI.  BP stable.  #Hx of STEMI s/p manual thrombectomy and DES of the mid LAD (02/2023)  #Hx of Recurrent DVT #CAD #HLD -Continue home aspirin and Brilinta -Continue home Eliquis -Continue home Lipitor and Zetia  #Anxiety/Depression Holding home Abilify and Xanax in the setting of recurrent falls. -Continue home Effexor  Diet: Normal VTE: DOAC IVF: NS,100cc/hr Code: DNI  Prior to Admission Living Arrangement: Home, living alone Anticipated Discharge Location: Home Barriers to Discharge: Evaluation  Dispo: Admit patient to Inpatient with expected length of stay greater than 2 midnights.  Signed: Carmina Miller, DO Internal Medicine Resident PGY-1 01/02/2024, 6:16 PM

## 2024-01-02 NOTE — ED Notes (Addendum)
 Pt urinated in stretcher. Linens changed and brief put on.

## 2024-01-03 LAB — GASTROINTESTINAL PANEL BY PCR, STOOL (REPLACES STOOL CULTURE)

## 2024-01-03 LAB — BASIC METABOLIC PANEL
Anion gap: 9 (ref 5–15)
BUN: 21 mg/dL (ref 8–23)
CO2: 16 mmol/L — ABNORMAL LOW (ref 22–32)
Calcium: 8.6 mg/dL — ABNORMAL LOW (ref 8.9–10.3)
Chloride: 109 mmol/L (ref 98–111)
Creatinine, Ser: 1.09 mg/dL — ABNORMAL HIGH (ref 0.44–1.00)
GFR, Estimated: 58 mL/min — ABNORMAL LOW (ref >60)
Glucose, Bld: 109 mg/dL — ABNORMAL HIGH (ref 70–99)
Potassium: 3.8 mmol/L (ref 3.5–5.1)
Sodium: 134 mmol/L — ABNORMAL LOW (ref 135–145)

## 2024-01-03 LAB — CBC
HCT: 32.9 % — ABNORMAL LOW (ref 36.0–46.0)
Hemoglobin: 10.9 g/dL — ABNORMAL LOW (ref 12.0–15.0)
MCH: 28.4 pg (ref 26.0–34.0)
MCHC: 33.1 g/dL (ref 30.0–36.0)
MCV: 85.7 fL (ref 80.0–100.0)
Platelets: 195 10*3/uL (ref 150–400)
RBC: 3.84 MIL/uL — ABNORMAL LOW (ref 3.87–5.11)
RDW: 17.9 % — ABNORMAL HIGH (ref 11.5–15.5)
WBC: 6.2 10*3/uL (ref 4.0–10.5)
nRBC: 0 % (ref 0.0–0.2)

## 2024-01-03 LAB — ACTH STIMULATION, 3 TIME POINTS
Cortisol, 30 Min: 34 ug/dL
Cortisol, 60 Min: 38.9 ug/dL
Cortisol, Base: 21.1 ug/dL

## 2024-01-03 LAB — HIV ANTIBODY (ROUTINE TESTING W REFLEX): HIV Screen 4th Generation wRfx: NONREACTIVE

## 2024-01-03 MED ORDER — MELATONIN 5 MG PO TABS
5.0000 mg | ORAL_TABLET | Freq: Every day | ORAL | Status: DC
  Administered 2024-01-03: 5 mg via ORAL
  Filled 2024-01-03: qty 1

## 2024-01-03 MED ORDER — CARVEDILOL 6.25 MG PO TABS
9.3750 mg | ORAL_TABLET | Freq: Two times a day (BID) | ORAL | Status: DC
  Administered 2024-01-03 – 2024-01-04 (×2): 9.375 mg via ORAL
  Filled 2024-01-03 (×2): qty 1

## 2024-01-03 MED ORDER — LACTATED RINGERS IV SOLN
INTRAVENOUS | Status: DC
Start: 2024-01-03 — End: 2024-01-04

## 2024-01-03 MED ORDER — ASPIRIN 81 MG PO CHEW
81.0000 mg | CHEWABLE_TABLET | Freq: Every day | ORAL | Status: DC
  Administered 2024-01-04: 81 mg via ORAL
  Filled 2024-01-03 (×2): qty 1

## 2024-01-03 NOTE — Evaluation (Signed)
Physical Therapy Brief Evaluation and Discharge Note Patient Details Name: Rachel Chen MRN: [id number] DOB: 1961-05-03 Today's Date: 01/03/2024   History of Present Illness  63 yo F presents to [facility] 01/02/24 with watery loose stool and recent falls. Pt mentioned R sided weakness, however, MRI negative w/ no evidence of infarct. Admitted w/ sepsis 2/2 UTI and AKI. PMHx: spinal stenosis and surgeries, CAD/MI with stent (03-08-2023), DVT, CAD   Clinical Impression  Pt in bed upon arrival and agreeable to PT eval. Prior to admit, pt was independent with no AD. Pt reports multiple falls, however, she attributes these to blood pressure issues and medications. Pt was supervision for standing and CGA for ambulating in the hall with no AD. Upon asking pt to complete a task from DGI, pt became frustrated and wanted to return the room. Pt has 1 LOB that was corrected with a side-step and CGA. Once in the room, pt declined further PT eval and all future PT services. I educated pt on benefits of acute and OP PT to work on improving balance and prevent future falls, however, pt declines. Acute PT signing off.        PT Assessment  (Recommending OP PT, however, pt declines all acute and post acute PT)  Assistance Needed at Discharge  Set up Supervision/Assistance    Equipment Recommendations None recommended by PT     Precautions/Restrictions Precautions Precautions: Fall Restrictions Weight Bearing Restrictions Per Provider Order: No        Mobility  Bed Mobility     General bed mobility comments: on EOB upon arrival  Transfers Overall transfer level: Needs assistance Equipment used: None Transfers: Sit to/from Stand Sit to Stand: Supervision    General transfer comment: supervision w/ no AD    Ambulation/Gait Ambulation/Gait assistance: Contact guard assist Gait Distance (Feet): 60 Feet Assistive device: IV Pole, None Gait Pattern/deviations: Step-through pattern, Shuffle Gait  Speed: Below normal General Gait Details: 1 LOB when ambulating w/ no AD, pt able to recover with side-step. Pt declined further PT eval after LOB and returned to room. Unable to start/finish DGI assessment      Balance Overall balance assessment: Needs assistance, Mild deficits observed, not formally tested, History of Falls Sitting-balance support: No upper extremity supported, Feet supported Sitting balance-Leahy Scale: Good     Standing balance support: No upper extremity supported Standing balance-Leahy Scale: Fair Standing balance comment: able to stand w/ no UE support          Pertinent Vitals/Pain PT - Brief Vital Signs All Vital Signs Stable: Yes Pain Assessment Pain Assessment: No/denies pain     Home Living Family/patient expects to be discharged to:: Private residence Living Arrangements: Alone Available Help at Discharge: Family;Available PRN/intermittently Home Environment: Stairs to enter;Rail - right;Rail - left  Stairs-Number of Steps: 7 Home Equipment: Shower seat - built in;Hand held shower head;Grab bars - tub/shower;Rolling Environmental consultant (2 wheels);Cane - single point        Prior Function Level of Independence: Independent      UE/LE Assessment       LE ROM/Strength/Tone/Coordination: [facility]      Communication   Communication Communication: No apparent difficulties Cueing Techniques: Verbal cues     Cognition Overall Cognitive Status: Appears within functional limits for tasks assessed/performed       General Comments General comments (skin integrity, edema, etc.): VSS on RA, pt was frustrated about frequent interruptions from staff. Pt was initially agreeable to short PT eval, however, became frustrated  when asked to perform task from DGI and requested to return to the room. Pt declines all acute PT and post acute PT     PT Visit Diagnosis Unsteadiness on feet (R26.81);History of falling (Z91.81);Muscle weakness (generalized) (M62.81)       AMPAC 6 Clicks Help needed turning from your back to your side while in a flat bed without using bedrails?: None Help needed moving from lying on your back to sitting on the side of a flat bed without using bedrails?: None Help needed moving to and from a bed to a chair (including a wheelchair)?: A Little Help needed standing up from a chair using your arms (e.g., wheelchair or bedside chair)?: A Little Help needed to walk in hospital room?: A Little Help needed climbing 3-5 steps with a railing? : A Little 6 Click Score: 20      End of Session   Activity Tolerance: Other (comment);Treatment limited secondary to agitation (pt declined further eval) Patient left: in bed;with call bell/phone within reach Nurse Communication: Mobility status PT Visit Diagnosis: Unsteadiness on feet (R26.81);History of falling (Z91.81);Muscle weakness (generalized) (M62.81)     Time: 1610-9604 PT Time Calculation (min) (ACUTE ONLY): 13 min  Charges:   PT Evaluation $PT Eval Low Complexity: 1 Low     Hilton Cork, PT, DPT Secure Chat Preferred  Rehab Office [phone]  Arturo Morton Brion Aliment  01/03/2024, 5:04 PM

## 2024-01-03 NOTE — Progress Notes (Signed)
PRN albuterol given for expiratory wheezing , patient reports relief after treatment, no wheezing on auscultation.

## 2024-01-03 NOTE — Progress Notes (Signed)
Subjective Patient feels much better overall. Strength has improved.  Physical exam Blood pressure (!) 151/60, pulse 73, temperature 97.8 F (36.6 C), temperature source Oral, resp. rate 18, height 5\' 5"  (1.651 m), weight 94.8 kg, SpO2 90%.  Constitutional: obese, sitting up in bed, NAD Cardiovascular: regular rate and rhythm, no m/r/g, no LEE Pulmonary/Chest: normal work of breathing on RA, lungs CTAB Abdominal: soft, non-tender, non-distended Neurological: alert & oriented, 4/5 right-sided U/LE strength, 5/5 left-sided U/LE strength Skin: warm and dry  Weight change:    Intake/Output Summary (Last 24 hours) at 01/03/2024 1352 Last data filed at 01/03/2024 0522 Gross per 24 hour  Intake 1100.86 ml  Output --  Net 1100.86 ml   Net IO Since Admission: 2,100.86 mL [01/03/24 1352]  Labs, images, and other studies    Latest Ref Rng & Units 01/03/2024    8:24 AM 01/02/2024   11:15 AM 01/02/2024   11:14 AM  CBC  WBC 4.0 - 10.5 K/uL 6.2     Hemoglobin 12.0 - 15.0 g/dL 82.9  56.2  13.0   Hematocrit 36.0 - 46.0 % 32.9  31.0  31.0   Platelets 150 - 400 K/uL 195          Latest Ref Rng & Units 01/03/2024    8:24 AM 01/02/2024    3:00 PM 01/02/2024   11:15 AM  BMP  Glucose 70 - 99 mg/dL 865  784  696   BUN 8 - 23 mg/dL 21  38  53   Creatinine 0.44 - 1.00 mg/dL 2.95  2.84  1.32   Sodium 135 - 145 mmol/L 134  130  125   Potassium 3.5 - 5.1 mmol/L 3.8  3.9  4.5   Chloride 98 - 111 mmol/L 109  102  97   CO2 22 - 32 mmol/L 16  17    Calcium 8.9 - 10.3 mg/dL 8.6  8.0       Assessment and plan Hospital day 1  Rachel Chen is a 63 y.o. female with PMH of STEMI s/p manual thrombectomy and DES of the mid LAD (02/2023), HFrEF (40-45% 02/2023), multiple DVT's, hypertension, HLD, anxiety, depression, chronic back pain, and obesity who presents to the ED after calling EMS for recurrent falls and admitted for severe sepsis with AKI.   Principal Problem:   Sepsis  (HCC) Active Problems:   Hyponatremia  #Sepsis 2/2 UTI Vitals stable, no leukocytosis. BC no growth < 24 hours. GI panel/C. Diff negative. Urine culture pending. -Continue IV Rocephin 1 g/day with plan for 5 days (day 2) -Follow up BC/UC  #AKI Much improved as creatinine this morning is 1.09 (3.27 on admission) with baseline ~ 0.9 -UTI treatment per above -Continue LR at 100 cc/hour -Trend BMP -Avoid nephrotoxic agents if possible   #Hyponatremia 134 this morning. Improved with fluids and po intake   #Right-sided weakness #Recurrent Falls Much improved from yesterday. Unclear etiology as imaging for stroke has been unremarkable. OT not recommending follow-up. PT pending. Cosyntropin stimulation test unremarkable.  #Chronic Normocytic Anemia Hemoglobin stable at 10.9 on admission which is at or better than baseline.  Patient does have a history of iron deficiency for which she takes iron supplements.  Recent iron panel unremarkable. -Continue home iron   #Aneurysmal dilatation of the right common iliac artery Noted on CTAP. Measuring 1.8 cm in diameter. This is not significantly changed since the prior  exam.  -Vascular surgery consultation outpatient   Chronic Conditions   #Hypertension #HFrEF Holding home Lasix, lisinopril, and Aldactone in the setting of AKI.  BP stable starting to increase to 150's. -Resume home Coreg   #Hx of STEMI s/p manual thrombectomy and DES of the mid LAD (02/2023)  #Hx of Recurrent DVT #CAD #HLD -Continue home ASA and Brilinta -Continue home Eliquis -Continue home Lipitor and Zetia   #Anxiety/Depression Holding home Abilify, Gabapentin, and Xanax in the setting of recurrent falls. -Continue home Effexor -Melatonin at bedtime  #Chronic Back Pain 2/2 Spinal Stenosis Denies back pain. -Tylenol prn   Diet: Normal VTE: DOAC IVF: LR,100cc/hr Code: DNI   Carmina Miller, DO 01/03/2024, 1:52 PM  Pager: [phone] After 5pm or weekend:  [phone]

## 2024-01-03 NOTE — Evaluation (Signed)
Occupational Therapy Evaluation Patient Details Name: Rachel Chen MRN: [id number] DOB: 1961-03-08 Today's Date: 01/03/2024   History of Present Illness 63 yo F with hx of spinal stenosis and surgeries, CAD/MI with stent (03-08-2023), adm 1/15 with 3 days of watery loose stools (4/day, no f/c, no risk contacts). She also c/o stomach cramps and anorexia during this period.   She has had weakness and falls recently.   She also c/o DOE and SOB as well as R sided weakness. MRI:No acute intracranial process. No evidence of acute or subacute  infarct.   Clinical Impression   Patient admitted for the diagnosis above.  PTA she lives at home alone, her son assists with yard work and heavy iADL, otherwise she is independent with ADL, community mobility and lighter iADL.  Patient does have a walking stick she uses outside due to chronic back pain.  Currently loose stool with exertion, and generalized weakness are deficits impacting independence.  Patient is needing Min A for lower body ADL and generalized CGA/supervision for mobility.  Patient needing Min A for bed mobility due to weakness and ED stretcher.  OT will follow in the acute setting to address deficits, and no post acute OT is anticipated depending on progress.         If plan is discharge home, recommend the following: Assist for transportation;Assistance with cooking/housework    Functional Status Assessment  Patient has had a recent decline in their functional status and demonstrates the ability to make significant improvements in function in a reasonable and predictable amount of time.  Equipment Recommendations  None recommended by OT    Recommendations for Other Services       Precautions / Restrictions Precautions Precautions: Fall Precaution Comments: loose stool with exertion Restrictions Weight Bearing Restrictions Per Provider Order: No      Mobility Bed Mobility Overal bed mobility: Needs Assistance Bed Mobility:  Supine to Sit, Sit to Supine     Supine to sit: Min assist Sit to supine: Min assist        Transfers Overall transfer level: Needs assistance Equipment used: Rolling walker (2 wheels) Transfers: Sit to/from Stand Sit to Stand: Supervision                  Balance Overall balance assessment: Needs assistance Sitting-balance support: Feet unsupported Sitting balance-Leahy Scale: Good     Standing balance support: Reliant on assistive device for balance Standing balance-Leahy Scale: Fair                             ADL either performed or assessed with clinical judgement   ADL       Grooming: Wash/dry hands;Wash/dry face;Set up;Sitting               Lower Body Dressing: Minimal assistance;Sit to/from stand   Toilet Transfer: Contact guard assist;Rolling walker (2 wheels);BSC/3in1;Stand-pivot                   Vision Patient Visual Report: No change from baseline       Perception Perception: Not tested       Praxis Praxis: Not tested       Pertinent Vitals/Pain Pain Assessment Pain Assessment: Faces Faces Pain Scale: Hurts a little bit Pain Location: headache Pain Descriptors / Indicators: Aching Pain Intervention(s): Monitored during session     Extremity/Trunk Assessment Upper Extremity Assessment Upper Extremity Assessment: Overall WFL for tasks assessed;Right hand dominant  Lower Extremity Assessment Lower Extremity Assessment: Defer to PT evaluation   Cervical / Trunk Assessment Cervical / Trunk Assessment: Normal   Communication Communication Communication: No apparent difficulties   Cognition Arousal: Alert Behavior During Therapy: WFL for tasks assessed/performed Overall Cognitive Status: Within Functional Limits for tasks assessed                                       General Comments   VSS on RA    Exercises     Shoulder Instructions      Home Living Family/patient expects to be  discharged to:: Private residence Living Arrangements: Alone Available Help at Discharge: Family;Available PRN/intermittently Type of Home: House Home Access: Stairs to enter [employer] of Steps: 7 Entrance Stairs-Rails: Right;Left;Can reach both Home Layout: One level     Bathroom Shower/Tub: Producer, television/film/video: Standard Bathroom Accessibility: Yes How Accessible: Accessible via walker Home Equipment: Shower seat - built in;Hand held shower head;Grab bars - tub/shower;Rolling Walker (2 wheels);Cane - single point          Prior Functioning/Environment Prior Level of Function : Independent/Modified Independent;Driving               ADLs Comments: Son assists with yard work and heavy iADL due to prior back injuries.        OT Problem List: Decreased strength;Decreased activity tolerance;Impaired balance (sitting and/or standing)      OT Treatment/Interventions:      OT Goals(Current goals can be found in the care plan section) Acute Rehab OT Goals Patient Stated Goal: Return home once feeling better OT Goal Formulation: With patient Time For Goal Achievement: 01/17/24 Potential to Achieve Goals: Good  OT Frequency:      Co-evaluation              AM-PAC OT "6 Clicks" Daily Activity     Outcome Measure Help from another person eating meals?: None Help from another person taking care of personal grooming?: None Help from another person toileting, which includes using toliet, bedpan, or urinal?: A Little Help from another person bathing (including washing, rinsing, drying)?: A Little Help from another person to put on and taking off regular upper body clothing?: None Help from another person to put on and taking off regular lower body clothing?: A Little 6 Click Score: 21   End of Session Equipment Utilized During Treatment: Rolling walker (2 wheels) Nurse Communication: Mobility status  Activity Tolerance: Patient tolerated  treatment well Patient left: in bed;with call bell/phone within reach;with family/visitor present  OT Visit Diagnosis: Unsteadiness on feet (R26.81);Muscle weakness (generalized) (M62.81)                Time: 4696-2952 OT Time Calculation (min): 22 min Charges:  OT General Charges $OT Visit: 1 Visit OT Evaluation $OT Eval Moderate Complexity: 1 Mod  01/03/2024  RP, OTR/L  Acute Rehabilitation Services  Office:  [phone]   Suzanna Obey 01/03/2024, 11:46 AM

## 2024-01-03 NOTE — Plan of Care (Signed)

## 2024-01-04 LAB — BASIC METABOLIC PANEL
Anion gap: 11 (ref 5–15)
BUN: 13 mg/dL (ref 8–23)
CO2: 17 mmol/L — ABNORMAL LOW (ref 22–32)
Calcium: 8.9 mg/dL (ref 8.9–10.3)
Chloride: 109 mmol/L (ref 98–111)
Creatinine, Ser: 0.79 mg/dL (ref 0.44–1.00)
GFR, Estimated: >60 mL/min (ref >60)
Glucose, Bld: 140 mg/dL — ABNORMAL HIGH (ref 70–99)
Potassium: 3.9 mmol/L (ref 3.5–5.1)
Sodium: 137 mmol/L (ref 135–145)

## 2024-01-04 LAB — URINE CULTURE: Culture: <10000 — AB

## 2024-01-04 MED ORDER — AMOXICILLIN-POT CLAVULANATE 500-125 MG PO TABS: 1.0000 | ORAL_TABLET | Freq: Two times a day (BID) | ORAL | 0 refills | Status: AC

## 2024-01-04 NOTE — Progress Notes (Signed)
Medication details completed in AVS.

## 2024-01-04 NOTE — Progress Notes (Signed)
DISCHARGE NOTE HOME Rachel Chen to be discharged Home per MD order. Discussed prescriptions and follow up appointments with the patient. Prescriptions given to patient; medication list explained in detail. Patient verbalized understanding.  Skin clean, dry and intact without evidence of skin break down, no evidence of skin tears noted. IV catheter discontinued intact. Site without signs and symptoms of complications. Dressing and pressure applied. Pt denies pain at the site currently. No complaints noted.  Patient free of lines, drains, and wounds.   An After Visit Summary (AVS) was printed and given to the patient. Patient escorted via wheelchair, and discharged home via private auto.  Velia Meyer, RN

## 2024-01-04 NOTE — TOC Initial Note (Signed)
Transition of Care (TOC) - Initial/Assessment Note   Spoke to patient and son at bedside.   Patient's PCP is Dillard Cannon in [place] , and she is active with OT PT at [facility] off of [facility]   Patient Details  Name: Rachel Chen MRN: [id number] Date of Birth: [birth year]  Transition of Care [facility]) CM/SW Contact:    Kingsley Plan, RN Phone Number: 01/04/2024, 12:14 PM  Clinical Narrative:                   Expected Discharge Plan: Home w Home Health Services Barriers to Discharge: Continued Medical Work up   Patient Goals and CMS Choice Patient states their goals for this hospitalization and ongoing recovery are:: to return to home          Expected Discharge Plan and Services   Discharge Planning Services: CM Consult   Living arrangements for the past 2 months: Single Family Home                 DME Arranged: N/A DME Agency: NA       HH Arranged: NA HH Agency: NA        Prior Living Arrangements/Services Living arrangements for the past 2 months: Single Family Home Lives with:: Self Patient language and need for interpreter reviewed:: Yes Do you feel safe going back to the place where you live?: Yes      Need for Family Participation in Patient Care: Yes (Comment) Care giver support system in place?: Yes (comment) Current home services: DME Criminal Activity/Legal Involvement Pertinent to Current Situation/Hospitalization: No - Comment as needed  Activities of Daily Living   ADL Screening (condition at time of admission) Independently performs ADLs?: Yes (appropriate for developmental age) Is the patient deaf or have difficulty hearing?: No Does the patient have difficulty seeing, even when wearing glasses/contacts?: No Does the patient have difficulty concentrating, remembering, or making decisions?: No  Permission Sought/Granted   Permission granted to share information with : Yes, Verbal Permission Granted  Share Information  with NAME: Cylinda Neilsen son           Emotional Assessment Appearance:: Appears stated age Attitude/Demeanor/Rapport: Engaged Affect (typically observed): Accepting Orientation: : Oriented to Self, Oriented to Place, Oriented to  Time, Oriented to Situation Alcohol / Substance Use: Not Applicable Psych Involvement: No (comment)  Admission diagnosis:  AKI (acute kidney injury) (HCC) [N17.9] Sepsis (HCC) [A41.9] Sepsis, due to unspecified organism, unspecified whether acute organ dysfunction present [facility]) [A41.9] Patient Active Problem List   Diagnosis Date Noted   Sepsis (HCC) 01/02/2024   Hyponatremia 01/02/2024   S/P TKR (total knee replacement) using cement, left 11/03/2021   S/P total knee replacement 10/24/2021   Chronic deep vein thrombosis (DVT) of popliteal vein of left lower extremity (HCC) 08/19/2021   Leg edema, left 08/19/2021   Primary hypertension 08/19/2021   Tobacco use 08/19/2021   Aortoiliac occlusive disease (HCC) 08/10/2021   Primary osteoarthritis of both hips 07/07/2021   Sacroiliitis (HCC) 07/07/2021   Primary osteoarthritis of knees, bilateral 05/25/2021   Closed displaced fracture of proximal phalanx of lesser toe of right foot 02/28/2021   Current use of long term anticoagulation 12/23/2020   DDD (degenerative disc disease), lumbar 11/23/2020   Facet arthritis of lumbar region 11/23/2020   History of lumbar surgery 11/23/2020   Chronic midline low back pain 10/21/2020   Pain of lumbar spine 06/10/2018   Postmenopausal bleeding 02/14/2016   PCP:  Pcp,  No Pharmacy:   [facility] - [place], [place] - [address] [place] [address] Phone: [phone] Fax: [phone]  [id number] - [place], [place] - [address] [place] [address] Phone: [phone] Fax: [phone]  [facility] - [place], [place] - [address] [place] [address] Phone:  [phone] Fax: [phone]     Social Drivers of Health (SDOH) Social History: SDOH Screenings   Food Insecurity: No Food Insecurity (01/03/2024)  Housing: Low Risk  (01/03/2024)  Transportation Needs: No Transportation Needs (01/03/2024)  Utilities: Not At Risk (01/03/2024)  Social Connections: Socially Isolated (01/03/2024)  Tobacco Use: High Risk (01/02/2024)   SDOH Interventions:     Readmission Risk Interventions     No data to display

## 2024-01-04 NOTE — Discharge Summary (Addendum)
Name: Rachel Chen SEARCH MRN: [id number] DOB: [birth year] 63 y.o. PCP: Pcp, No  Date of Admission: 01/02/2024 10:22 AM Date of Discharge:  01/04/2024 Attending Physician: Dr.  Ninetta Lights  DISCHARGE DIAGNOSIS:  Primary Problem: Recurrent Falls and UTI   DISCHARGE MEDICATIONS:   Allergies as of 01/04/2024   No Known Allergies      Medication List     STOP taking these medications    furosemide 40 MG tablet Commonly known as: LASIX       TAKE these medications    amoxicillin-clavulanate 500-125 MG tablet Commonly known as: Augmentin Take 1 tablet by mouth in the morning and at bedtime.   ARIPiprazole 2 MG tablet Commonly known as: ABILIFY Take 2 mg by mouth in the morning.   atorvastatin 80 MG tablet Commonly known as: LIPITOR Take 80 mg by mouth at bedtime.   Brilinta 90 MG Tabs tablet Generic drug: ticagrelor Take 90 mg by mouth 2 (two) times daily.   carvedilol 6.25 MG tablet Commonly known as: COREG Take 9.375 mg by mouth 2 (two) times daily.   Eliquis 5 MG Tabs tablet Generic drug: apixaban Take 5 mg by mouth 2 (two) times daily.   ezetimibe 10 MG tablet Commonly known as: ZETIA Take 10 mg by mouth at bedtime.   ferrous sulfate 325 (65 FE) MG EC tablet Take 325 mg by mouth daily with breakfast.   lisinopril 40 MG tablet Commonly known as: ZESTRIL Take 40 mg by mouth in the morning.   spironolactone 50 MG tablet Commonly known as: ALDACTONE Take 50 mg by mouth in the morning.   venlafaxine XR 75 MG 24 hr capsule Commonly known as: EFFEXOR-XR Take 75 mg by mouth in the morning, at noon, and at bedtime.        DISPOSITION AND FOLLOW-UP:  Ms.Aleiyah ABBEYGAIL TEICHMANN was discharged from [facility] in Stable condition. At the hospital follow up visit please address:  Polypharmacy Assess necessity for any remaining psychoactive medications. Xanax discontinued upon discharge  HFrEF Assess volume status and consider expedited Cardiology  appointment which is now scheduled for 05/2024.  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  UTI Ensure Abx were completed and resolution of dysuria  #Aneurysmal dilatation of the right common iliac artery Noted on CTAP. Measuring 1.8 cm in diameter. This is not significantly changed since the prior exam.  -Vascular surgery consultation outpatient HOSPITAL COURSE:  Patient Summary:   Rachel Chen is a 63 year old female with past medical history of STEMI s/p manual thrombectomy and DES of the mid LAD (02/2023), HFrEF (40-45% 02/2023), multiple DVT's, hypertension, HLD, anxiety, depression, chronic back pain, and obesity who presents to the ED after calling EMS for recurrent falls.   Patient states that on Sunday she began to experience aches, cough, congestion.  2 weeks ago she saw PCP for URI and was given a course of amoxicillin which she completed.  Symptoms resolved for 1 week and returned on Sunday.  On Monday she started having diarrhea (4 watery bowel movements with some form) and felt unsteady on her feet.  Yesterday she was getting up from the commode and fell.  She was too weak to get up so she laid there for approximately 2 hours until getting enough strength to crawl to her phone and call  her son who then came 30 minutes later to help her up.  Later that afternoon she fell again but was able to get up.  That evening she was in the shower and was reaching for the showerhead when she fell again.  This morning she fell as she was getting up out of bed and was too weak to get up.  She laid on the ground and fell asleep for approximately 6 hours and was able to get up after waking.  Denies hitting head with all falls. Patient endorses frontal bandlike headache that began this morning, chills,  exertional dyspnea, intermittent abdominal cramping, dysuria, right-sided weakness since Monday.  Respiratory symptoms have largely improved.  Patient had 4 watery bowel movements Monday and Tuesday but has not had any today.  Endorsing poor p.o. intake for the past 4 days 2/2 reduced appetite. Denies sick contacts, syncope/presyncope, visual changes, chest pain/palpitations.   Per EMS, patient had right-sided facial droop, hypotensive at 80/40 and was given 850 cc bolus.  On ED arrival patient hypotensive at 87/50 and was given additional bolus (approximately 3 L total) which normalized blood pressure. Sepsis protocol was activated and patient was started on IV Vancomycin, Cefepime, Flagyl.   Patient subsequently admitted for further management. See problem list below:   #Possible Sepsis 2/2 UTI See above for initial patient presentation.  IV vancomycin, cefepime, Flagyl were stopped and patient started on IV Rocephin on hospital day 2 and continued during admission. CBC without leukocytosis. BC no growth after 2 days. GI panel/C. Diff negative.  On day of discharge urine culture resulted growing less than 10,000 units.  Patient discharged on Augmentin to complete 5-day course.    #AKI Creatinine 3.2 on admission with baseline around 0.9.  Patient given approximately 4 L of fluid and was continued on maintenance hydration.  Creatinine markedly improved after 1 day and was at baseline on day of discharge.   #Hyponatremia 126 on admission in the setting of poor p.o. intake.  Normalized with fluids and diet.   #Right-sided weakness #Recurrent Falls #Hypotension CT head and MR brain showed no evidence of stroke.  Cosyntropin stimulation test unremarkable.  Etiology is most likely multifactorial in the setting of poor p.o. intake and polypharmacy.  Patient responded well with fluids, IV antibiotics, and holding psychoactive medications including gabapentin, Abilify, and Xanax.  Xanax to be  discontinued on discharge.  Psychiatry managing Abilify and Effexor so we will continue these on discharge.  See below for heart failure plan regarding hypotension.  Patient has already established PT outpatient which we will follow-up with.   #Chronic Normocytic Anemia Hemoglobin stable at 10.9 on admission which is at or better than baseline.  Patient does have a history of iron deficiency for which she takes iron supplements.  Recent iron panel unremarkable. -Continued home iron during admission   #Aneurysmal dilatation of the right common iliac artery Noted on CTAP. Measuring 1.8 cm in diameter. This is not significantly changed since the prior exam.  -Vascular surgery consultation outpatient   Chronic Conditions   #Hypertension #HFrEF Lasix, lisinopril, Coreg, and Aldactone held on admission in the setting of AKI and hypotension.  Patient responded well to fluids and BP became mildly hypertensive so home Coreg was restarted.  Patient will be discharged on all of the above.   #Hx of STEMI s/p manual thrombectomy and DES of the mid LAD (02/2023)  #Hx of Recurrent DVT #CAD #HLD -Continue home ASA and Brilinta -Continue home Eliquis -Continue home Lipitor  and Zetia   #Anxiety/Depression/Sleep Difficulty Abilify, Xanax, and gabapentin held in the setting of the above.  Effexor continued.  Patient counseled on the concept of polypharmacy and was comfortable discontinuing Xanax upon discharge.  Abilify managed by psychiatrist so we will continue and defer to psychiatry for further management   #Chronic Back Pain 2/2 Spinal Stenosis Denied back pain during the admission despite holding gabapentin.  Gabapentin to be discontinued upon discharge. DISCHARGE INSTRUCTIONS:   Discharge Instructions     Call MD for:  difficulty breathing, headache or visual disturbances   Complete by: As directed    Call MD for:  extreme fatigue   Complete by: As directed    Call MD for:  hives   Complete  by: As directed    Call MD for:  persistant dizziness or light-headedness   Complete by: As directed    Call MD for:  persistant nausea and vomiting   Complete by: As directed    Call MD for:  redness, tenderness, or signs of infection (pain, swelling, redness, odor or green/yellow discharge around incision site)   Complete by: As directed    Call MD for:  severe uncontrolled pain   Complete by: As directed    Call MD for:  temperature >100.4   Complete by: As directed    Diet - low sodium heart healthy   Complete by: As directed    Discharge instructions   Complete by: As directed    You were treated for recurrent falls which was associated with right-sided weakness and a urinary tract infection. All the above has improved with fluids, antibiotics, and stopping some of your psychoactive medications including Xanax, Gabapentin, and Abilify. I believe a component of polypharmacy was contributing to your weakness/falls. We are stopping your Xanax. Although we stopped your Abilify during admission, we will continue it on discharge but please reach out to your Psychiatrist regarding this admission and for further management. In terms of issues with sleep, try to get OTC Melatonin and take it an hour or two before bedtime. This will be much better than using strong medications. Please take all medications as prescribed and follow-up with your PCP ASAP regarding this admission.   Increase activity slowly   Complete by: As directed    No wound care   Complete by: As directed        SUBJECTIVE:  Patient feels well and is comfortable with discharge Discharge Vitals:   BP 130/79 (BP Location: Right Arm)   Pulse 87   Temp 99.8 F (37.7 C) (Oral)   Resp 18   Ht 5\' 5"  (1.651 m)   Wt 94.8 kg   SpO2 100%   BMI 34.78 kg/m   OBJECTIVE:   Physical Exam Constitutional: obese, sitting up in bed, NAD Cardiovascular: regular rate and rhythm, no m/r/g, no LEE Pulmonary/Chest: normal work of  breathing on RA, lungs CTAB Abdominal: soft, non-tender, non-distended Neurological: alert & oriented, 5/5 right-sided U/LE strength, 5/5 left-sided U/LE strength, gait improved Skin: warm and dry  Pertinent Labs, Studies, and Procedures:     Latest Ref Rng & Units 01/03/2024    8:24 AM 01/02/2024   11:15 AM 01/02/2024   11:14 AM  CBC  WBC 4.0 - 10.5 K/uL 6.2     Hemoglobin 12.0 - 15.0 g/dL 62.1  30.8  65.7   Hematocrit 36.0 - 46.0 % 32.9  31.0  31.0   Platelets 150 - 400 K/uL 195  Latest Ref Rng & Units 01/04/2024    7:07 AM 01/03/2024    8:24 AM 01/02/2024    3:00 PM  CMP  Glucose 70 - 99 mg/dL 191  478  295   BUN 8 - 23 mg/dL 13  21  38   Creatinine 0.44 - 1.00 mg/dL 6.21  3.08  6.57   Sodium 135 - 145 mmol/L 137  134  130   Potassium 3.5 - 5.1 mmol/L 3.9  3.8  3.9   Chloride 98 - 111 mmol/L 109  109  102   CO2 22 - 32 mmol/L 17  16  17    Calcium 8.9 - 10.3 mg/dL 8.9  8.6  8.0     MR BRAIN WO CONTRAST Result Date: 01/02/2024 CLINICAL DATA:  Stroke suspected EXAM: MRI HEAD WITHOUT CONTRAST TECHNIQUE: Multiplanar, multiecho pulse sequences of the brain and surrounding structures were obtained without intravenous contrast. COMPARISON:  No prior MRI available, correlation is made with CT head 01/02/2024 FINDINGS: Brain: No restricted diffusion to suggest acute or subacute infarct. No acute hemorrhage, mass, mass effect, or midline shift. No hydrocephalus or extra-axial collection. Pituitary and craniocervical junction within normal limits. No hemosiderin deposition to suggest remote hemorrhage. Age related cerebral atrophy. Scattered T2 hyperintense signal in the periventricular white matter, likely the sequela of mild chronic small vessel ischemic disease. Vascular: Normal arterial flow voids. Skull and upper cervical spine: Normal marrow signal. Sinuses/Orbits: Mild mucosal thickening in the anterior ethmoid air cells. No acute finding in the orbits. Other: The mastoid air cells  are well aerated. IMPRESSION: No acute intracranial process. No evidence of acute or subacute infarct. Electronically Signed   By: Wiliam Ke M.D.   On: 01/02/2024 19:20   CT CHEST ABDOMEN PELVIS WO CONTRAST Result Date: 01/02/2024 CLINICAL DATA:  History of multiple falls.  Concern for sepsis. EXAM: CT CHEST, ABDOMEN AND PELVIS WITHOUT CONTRAST TECHNIQUE: Multidetector CT imaging of the chest, abdomen and pelvis was performed following the standard protocol without IV contrast. RADIATION DOSE REDUCTION: This exam was performed according to the departmental dose-optimization program which includes automated exposure control, adjustment of the mA and/or kV according to patient size and/or use of iterative reconstruction technique. COMPARISON:  CT chest dated March 16, 2019. CT abdomen/pelvis dated November 15, 2022. FINDINGS: CT CHEST FINDINGS Cardiovascular: The thoracic aorta is normal in caliber. Atherosclerotic calcification of the thoracic aorta and arch branch vessels. Borderline cardiomegaly. Multivessel coronary artery calcifications. No pericardial effusion. Mediastinum/Nodes: No enlarged mediastinal, hilar, or axillary lymph nodes. Thyroid gland and trachea are grossly unremarkable. Similar moderate-to-large sliding-type hiatal hernia is again noted. Lungs/Pleura: Mild upper lobe predominant centrilobular emphysema. Linear atelectasis/scarring in the right middle lobe. Mild bibasilar linear atelectasis/scarring. No suspicious pulmonary nodule. No focal consolidation, pleural effusion, or pneumothorax. Musculoskeletal: Severe degenerative changes of the right glenohumeral joint with joint space narrowing and marginal osteophytosis. There is a right glenohumeral joint effusion with lobulated fluid collections insinuating from the joint space (series 2, image 18). Remote healed right posterior fifth rib fracture. Remote healed nonunited right lateral eighth and ninth rib fractures. Remote healed left  posterior tenth rib fracture. CT ABDOMEN PELVIS FINDINGS Hepatobiliary: No focal liver abnormality is seen. No gallstones, gallbladder wall thickening, or biliary dilatation. Pancreas: Unremarkable. No pancreatic ductal dilatation or surrounding inflammatory changes. Spleen: Normal in size without focal abnormality. Adrenals/Urinary Tract: Stable benign left adrenal adenoma, for which no follow-up imaging is needed. The right adrenal gland is unremarkable. Similar right renal cortical scarring. No  renal calculi or hydronephrosis. Bladder is unremarkable. Stomach/Bowel: Moderate-to-large sliding-type hiatal hernia, similar to the prior exam. Appendix appears normal. No evidence of bowel wall thickening, distention, or inflammatory changes. Colonic diverticulosis without evidence of acute diverticulitis. Vascular/Lymphatic: The abdominal aorta is normal in caliber. Aortic atherosclerosis. 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 No enlarged abdominopelvic lymph nodes. Reproductive: Uterus and bilateral adnexa are unremarkable. Other: No abdominopelvic ascites. No intraperitoneal free air. Again noted is a fat containing supraumbilical ventral abdominal wall hernia with the abdominal wall defect measuring approximately 3.8 cm. Additional lower fat containing ventral abdominal wall hernia. Musculoskeletal: No acute osseous abnormality. No suspicious osseous lesion. Similar serpiginous sclerotic change at the right femoral head, suggestive of avascular necrosis. No definite evidence of subchondral collapse. Multilevel degenerative changes of the spine, most pronounced at the lumbar spine with similar grade 1 anterolisthesis of L4-L5. IMPRESSION: 1. No acute localizing findings in the chest, abdomen, or pelvis. 2. Moderate-to-large hiatal hernia, similar to the prior exam. 3. Colonic diverticulosis without evidence of acute  diverticulitis. 4. 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 This is not significantly changed since the prior exam. Recommend nonemergent vascular surgery consultation and/or follow-up imaging as indicated. 5. Severe degenerative changes of the right glenohumeral joint with joint effusion and lobulated fluid collection insinuating from the joint space. 6. Additional unchanged ancillary findings, as described above. Aortic Atherosclerosis (ICD10-I70.0). Electronically Signed   By: Hart Robinsons M.D.   On: 01/02/2024 13:20   DG Chest Port 1 View Result Date: 01/02/2024 CLINICAL DATA:  Concern for sepsis.  Hypotension. EXAM: PORTABLE CHEST 1 VIEW COMPARISON:  Chest radiograph dated August 13, 2023. FINDINGS: Mild cardiomegaly, unchanged. Aortic atherosclerosis. Linear atelectasis/scarring at the mid right lung. No focal consolidation, sizeable pleural effusion, or pneumothorax. No acute osseous abnormality. IMPRESSION: 1. No acute cardiopulmonary findings. 2. Mild cardiomegaly. Electronically Signed   By: Hart Robinsons M.D.   On: 01/02/2024 12:46   CT Head Wo Contrast Result Date: 01/02/2024 CLINICAL DATA:  Head trauma, moderate-severe; Neck trauma, dangerous injury mechanism (Age 38-64y) EXAM: CT HEAD WITHOUT CONTRAST CT CERVICAL SPINE WITHOUT CONTRAST TECHNIQUE: Multidetector CT imaging of the head and cervical spine was performed following the standard protocol without intravenous contrast. Multiplanar CT image reconstructions of the cervical spine were also generated. RADIATION DOSE REDUCTION: This exam was performed according to the departmental dose-optimization program which includes automated exposure control, adjustment of the mA and/or kV according to patient size and/or use of iterative reconstruction technique. COMPARISON:  CT head and C Spine 08/13/23 FINDINGS: CT HEAD FINDINGS Brain: No evidence of acute infarction, hemorrhage, hydrocephalus,  extra-axial collection or mass lesion/mass effect. Vascular: No hyperdense vessel or unexpected calcification. Skull: Normal. Negative for fracture or focal lesion. Sinuses/Orbits: No middle ear or mastoid effusion. Paranasal sinuses are clear. Orbits are unremarkable. Other: None. CT CERVICAL SPINE FINDINGS Alignment: Trace anterolisthesis of C4 on C5 Skull base and vertebrae: No acute fracture. No primary bone lesion or focal pathologic process. Soft tissues and spinal canal: No prevertebral fluid or swelling. No visible canal hematoma. Disc levels:  No CT evidence of high-grade spinal canal stenosis Upper chest: Negative. Other: None IMPRESSION: 1. No acute intracranial abnormality. 2. No acute fracture or traumatic subluxation of the cervical spine. Electronically Signed   By: Lorenza Cambridge M.D.   On: 01/02/2024 12:16   CT Cervical Spine Wo Contrast Result Date:  01/02/2024 CLINICAL DATA:  Head trauma, moderate-severe; Neck trauma, dangerous injury mechanism (Age 75-64y) EXAM: CT HEAD WITHOUT CONTRAST CT CERVICAL SPINE WITHOUT CONTRAST TECHNIQUE: Multidetector CT imaging of the head and cervical spine was performed following the standard protocol without intravenous contrast. Multiplanar CT image reconstructions of the cervical spine were also generated. RADIATION DOSE REDUCTION: This exam was performed according to the departmental dose-optimization program which includes automated exposure control, adjustment of the mA and/or kV according to patient size and/or use of iterative reconstruction technique. COMPARISON:  CT head and C Spine 08/13/23 FINDINGS: CT HEAD FINDINGS Brain: No evidence of acute infarction, hemorrhage, hydrocephalus, extra-axial collection or mass lesion/mass effect. Vascular: No hyperdense vessel or unexpected calcification. Skull: Normal. Negative for fracture or focal lesion. Sinuses/Orbits: No middle ear or mastoid effusion. Paranasal sinuses are clear. Orbits are unremarkable. Other:  None. CT CERVICAL SPINE FINDINGS Alignment: Trace anterolisthesis of C4 on C5 Skull base and vertebrae: No acute fracture. No primary bone lesion or focal pathologic process. Soft tissues and spinal canal: No prevertebral fluid or swelling. No visible canal hematoma. Disc levels:  No CT evidence of high-grade spinal canal stenosis Upper chest: Negative. Other: None IMPRESSION: 1. No acute intracranial abnormality. 2. No acute fracture or traumatic subluxation of the cervical spine. Electronically Signed   By: Lorenza Cambridge M.D.   On: 01/02/2024 12:16     Signed: Carmina Miller, DO  Internal Medicine Resident, PGY-1 [facility]  Pager: [phone] 12:34 PM, 01/04/2024

## 2024-01-07 LAB — CULTURE, BLOOD (ROUTINE X 2)
Culture: NO GROWTH
Culture: NO GROWTH

## 2024-01-16 DIAGNOSIS — F329 Major depressive disorder, single episode, unspecified: Secondary | ICD-10-CM | POA: Diagnosis not present

## 2024-01-17 DIAGNOSIS — R300 Dysuria: Secondary | ICD-10-CM | POA: Diagnosis not present

## 2024-01-17 DIAGNOSIS — A429 Actinomycosis, unspecified: Secondary | ICD-10-CM | POA: Diagnosis not present

## 2024-02-06 DIAGNOSIS — M47816 Spondylosis without myelopathy or radiculopathy, lumbar region: Secondary | ICD-10-CM | POA: Diagnosis not present

## 2024-02-15 DIAGNOSIS — M62838 Other muscle spasm: Secondary | ICD-10-CM | POA: Diagnosis not present

## 2024-02-15 DIAGNOSIS — M25511 Pain in right shoulder: Secondary | ICD-10-CM | POA: Diagnosis not present

## 2024-02-15 DIAGNOSIS — Z7901 Long term (current) use of anticoagulants: Secondary | ICD-10-CM | POA: Diagnosis not present

## 2024-02-18 DIAGNOSIS — M47816 Spondylosis without myelopathy or radiculopathy, lumbar region: Secondary | ICD-10-CM | POA: Diagnosis not present

## 2024-03-03 DIAGNOSIS — M25511 Pain in right shoulder: Secondary | ICD-10-CM | POA: Diagnosis not present

## 2024-03-03 DIAGNOSIS — M62838 Other muscle spasm: Secondary | ICD-10-CM | POA: Diagnosis not present

## 2024-03-03 DIAGNOSIS — G8929 Other chronic pain: Secondary | ICD-10-CM | POA: Diagnosis not present

## 2024-03-03 DIAGNOSIS — R300 Dysuria: Secondary | ICD-10-CM | POA: Diagnosis not present

## 2024-03-03 DIAGNOSIS — R82998 Other abnormal findings in urine: Secondary | ICD-10-CM | POA: Diagnosis not present

## 2024-03-03 DIAGNOSIS — N3001 Acute cystitis with hematuria: Secondary | ICD-10-CM | POA: Diagnosis not present

## 2024-03-03 DIAGNOSIS — M5441 Lumbago with sciatica, right side: Secondary | ICD-10-CM | POA: Diagnosis not present

## 2024-03-11 DIAGNOSIS — F411 Generalized anxiety disorder: Secondary | ICD-10-CM | POA: Diagnosis not present

## 2024-03-12 DIAGNOSIS — M25511 Pain in right shoulder: Secondary | ICD-10-CM | POA: Diagnosis not present

## 2024-03-12 DIAGNOSIS — G8929 Other chronic pain: Secondary | ICD-10-CM | POA: Diagnosis not present

## 2024-03-25 DIAGNOSIS — N3001 Acute cystitis with hematuria: Secondary | ICD-10-CM | POA: Diagnosis not present

## 2024-04-02 DIAGNOSIS — R0602 Shortness of breath: Secondary | ICD-10-CM | POA: Diagnosis not present

## 2024-04-02 DIAGNOSIS — J441 Chronic obstructive pulmonary disease with (acute) exacerbation: Secondary | ICD-10-CM | POA: Diagnosis not present

## 2024-04-02 DIAGNOSIS — I2109 ST elevation (STEMI) myocardial infarction involving other coronary artery of anterior wall: Secondary | ICD-10-CM | POA: Diagnosis not present

## 2024-04-02 DIAGNOSIS — R079 Chest pain, unspecified: Secondary | ICD-10-CM | POA: Diagnosis not present

## 2024-04-02 DIAGNOSIS — I491 Atrial premature depolarization: Secondary | ICD-10-CM | POA: Diagnosis not present

## 2024-04-02 DIAGNOSIS — F17200 Nicotine dependence, unspecified, uncomplicated: Secondary | ICD-10-CM | POA: Diagnosis not present

## 2024-04-02 DIAGNOSIS — K449 Diaphragmatic hernia without obstruction or gangrene: Secondary | ICD-10-CM | POA: Diagnosis not present

## 2024-04-03 DIAGNOSIS — I2510 Atherosclerotic heart disease of native coronary artery without angina pectoris: Secondary | ICD-10-CM | POA: Diagnosis not present

## 2024-04-03 DIAGNOSIS — R079 Chest pain, unspecified: Secondary | ICD-10-CM | POA: Diagnosis not present

## 2024-04-03 DIAGNOSIS — J441 Chronic obstructive pulmonary disease with (acute) exacerbation: Secondary | ICD-10-CM | POA: Diagnosis not present

## 2024-04-03 DIAGNOSIS — Q219 Congenital malformation of cardiac septum, unspecified: Secondary | ICD-10-CM | POA: Diagnosis not present

## 2024-04-03 DIAGNOSIS — F17200 Nicotine dependence, unspecified, uncomplicated: Secondary | ICD-10-CM | POA: Diagnosis not present

## 2024-04-08 DIAGNOSIS — R079 Chest pain, unspecified: Secondary | ICD-10-CM | POA: Diagnosis not present

## 2024-04-16 DIAGNOSIS — I2510 Atherosclerotic heart disease of native coronary artery without angina pectoris: Secondary | ICD-10-CM | POA: Diagnosis not present

## 2024-04-16 DIAGNOSIS — Z87891 Personal history of nicotine dependence: Secondary | ICD-10-CM | POA: Diagnosis not present

## 2024-04-16 DIAGNOSIS — I252 Old myocardial infarction: Secondary | ICD-10-CM | POA: Diagnosis not present

## 2024-04-16 DIAGNOSIS — E789 Disorder of lipoprotein metabolism, unspecified: Secondary | ICD-10-CM | POA: Diagnosis not present

## 2024-04-16 DIAGNOSIS — I82532 Chronic embolism and thrombosis of left popliteal vein: Secondary | ICD-10-CM | POA: Diagnosis not present

## 2024-04-30 DIAGNOSIS — M545 Low back pain, unspecified: Secondary | ICD-10-CM | POA: Diagnosis not present

## 2024-04-30 DIAGNOSIS — M48062 Spinal stenosis, lumbar region with neurogenic claudication: Secondary | ICD-10-CM | POA: Diagnosis not present

## 2024-05-27 DIAGNOSIS — F411 Generalized anxiety disorder: Secondary | ICD-10-CM | POA: Diagnosis not present

## 2024-05-28 DIAGNOSIS — N858 Other specified noninflammatory disorders of uterus: Secondary | ICD-10-CM | POA: Diagnosis not present

## 2024-05-28 DIAGNOSIS — N761 Subacute and chronic vaginitis: Secondary | ICD-10-CM | POA: Diagnosis not present

## 2024-05-28 DIAGNOSIS — N950 Postmenopausal bleeding: Secondary | ICD-10-CM | POA: Diagnosis not present

## 2024-05-28 DIAGNOSIS — N958 Other specified menopausal and perimenopausal disorders: Secondary | ICD-10-CM | POA: Diagnosis not present

## 2024-05-28 DIAGNOSIS — T83711A Erosion of implanted vaginal mesh and other prosthetic materials to surrounding organ or tissue, initial encounter: Secondary | ICD-10-CM | POA: Diagnosis not present

## 2024-05-28 DIAGNOSIS — R87619 Unspecified abnormal cytological findings in specimens from cervix uteri: Secondary | ICD-10-CM | POA: Diagnosis not present

## 2024-06-24 DIAGNOSIS — E559 Vitamin D deficiency, unspecified: Secondary | ICD-10-CM | POA: Diagnosis not present

## 2024-06-24 DIAGNOSIS — I2510 Atherosclerotic heart disease of native coronary artery without angina pectoris: Secondary | ICD-10-CM | POA: Diagnosis not present

## 2024-06-24 DIAGNOSIS — F17200 Nicotine dependence, unspecified, uncomplicated: Secondary | ICD-10-CM | POA: Diagnosis not present

## 2024-06-24 DIAGNOSIS — M25511 Pain in right shoulder: Secondary | ICD-10-CM | POA: Diagnosis not present

## 2024-06-24 DIAGNOSIS — G8929 Other chronic pain: Secondary | ICD-10-CM | POA: Diagnosis not present

## 2024-06-24 DIAGNOSIS — M62838 Other muscle spasm: Secondary | ICD-10-CM | POA: Diagnosis not present

## 2024-06-30 DIAGNOSIS — M19011 Primary osteoarthritis, right shoulder: Secondary | ICD-10-CM | POA: Diagnosis not present

## 2024-07-14 DIAGNOSIS — M19011 Primary osteoarthritis, right shoulder: Secondary | ICD-10-CM | POA: Diagnosis not present

## 2024-07-14 DIAGNOSIS — M2540 Effusion, unspecified joint: Secondary | ICD-10-CM | POA: Diagnosis not present

## 2024-07-14 DIAGNOSIS — J439 Emphysema, unspecified: Secondary | ICD-10-CM | POA: Diagnosis not present

## 2024-07-14 DIAGNOSIS — M25411 Effusion, right shoulder: Secondary | ICD-10-CM | POA: Diagnosis not present

## 2024-07-15 DIAGNOSIS — J3489 Other specified disorders of nose and nasal sinuses: Secondary | ICD-10-CM | POA: Diagnosis not present

## 2024-07-15 DIAGNOSIS — R062 Wheezing: Secondary | ICD-10-CM | POA: Diagnosis not present

## 2024-07-15 DIAGNOSIS — J209 Acute bronchitis, unspecified: Secondary | ICD-10-CM | POA: Diagnosis not present

## 2024-07-15 DIAGNOSIS — R051 Acute cough: Secondary | ICD-10-CM | POA: Diagnosis not present

## 2024-07-31 DIAGNOSIS — R051 Acute cough: Secondary | ICD-10-CM | POA: Diagnosis not present

## 2024-07-31 DIAGNOSIS — R062 Wheezing: Secondary | ICD-10-CM | POA: Diagnosis not present

## 2024-07-31 DIAGNOSIS — R1110 Vomiting, unspecified: Secondary | ICD-10-CM | POA: Diagnosis not present

## 2024-07-31 DIAGNOSIS — R0981 Nasal congestion: Secondary | ICD-10-CM | POA: Diagnosis not present

## 2024-08-08 DIAGNOSIS — J019 Acute sinusitis, unspecified: Secondary | ICD-10-CM | POA: Diagnosis not present

## 2024-08-13 DIAGNOSIS — M19011 Primary osteoarthritis, right shoulder: Secondary | ICD-10-CM | POA: Diagnosis not present

## 2024-08-27 DIAGNOSIS — I2510 Atherosclerotic heart disease of native coronary artery without angina pectoris: Secondary | ICD-10-CM | POA: Diagnosis not present

## 2024-08-27 DIAGNOSIS — F411 Generalized anxiety disorder: Secondary | ICD-10-CM | POA: Diagnosis not present

## 2024-08-28 DIAGNOSIS — R079 Chest pain, unspecified: Secondary | ICD-10-CM | POA: Diagnosis not present

## 2024-08-28 DIAGNOSIS — Z1151 Encounter for screening for human papillomavirus (HPV): Secondary | ICD-10-CM | POA: Diagnosis not present

## 2024-08-28 DIAGNOSIS — N958 Other specified menopausal and perimenopausal disorders: Secondary | ICD-10-CM | POA: Diagnosis not present

## 2024-08-28 DIAGNOSIS — T83711D Erosion of implanted vaginal mesh and other prosthetic materials to surrounding organ or tissue, subsequent encounter: Secondary | ICD-10-CM | POA: Diagnosis not present

## 2024-08-28 DIAGNOSIS — Z124 Encounter for screening for malignant neoplasm of cervix: Secondary | ICD-10-CM | POA: Diagnosis not present

## 2024-08-28 DIAGNOSIS — Z8742 Personal history of other diseases of the female genital tract: Secondary | ICD-10-CM | POA: Diagnosis not present

## 2024-09-15 DIAGNOSIS — Z716 Tobacco abuse counseling: Secondary | ICD-10-CM | POA: Diagnosis not present

## 2024-09-15 DIAGNOSIS — E559 Vitamin D deficiency, unspecified: Secondary | ICD-10-CM | POA: Diagnosis not present

## 2024-09-15 DIAGNOSIS — F17200 Nicotine dependence, unspecified, uncomplicated: Secondary | ICD-10-CM | POA: Diagnosis not present

## 2024-09-17 DIAGNOSIS — M47816 Spondylosis without myelopathy or radiculopathy, lumbar region: Secondary | ICD-10-CM | POA: Diagnosis not present

## 2024-09-17 DIAGNOSIS — M533 Sacrococcygeal disorders, not elsewhere classified: Secondary | ICD-10-CM | POA: Diagnosis not present

## 2024-10-06 DIAGNOSIS — K3580 Unspecified acute appendicitis: Secondary | ICD-10-CM | POA: Diagnosis not present

## 2024-10-06 DIAGNOSIS — K353 Acute appendicitis with localized peritonitis, without perforation or gangrene: Secondary | ICD-10-CM | POA: Diagnosis not present

## 2024-10-06 DIAGNOSIS — R82998 Other abnormal findings in urine: Secondary | ICD-10-CM | POA: Diagnosis not present

## 2024-10-06 DIAGNOSIS — N3001 Acute cystitis with hematuria: Secondary | ICD-10-CM | POA: Diagnosis not present

## 2024-10-06 DIAGNOSIS — R509 Fever, unspecified: Secondary | ICD-10-CM | POA: Diagnosis not present

## 2024-10-06 DIAGNOSIS — R0600 Dyspnea, unspecified: Secondary | ICD-10-CM | POA: Diagnosis not present

## 2024-10-06 DIAGNOSIS — K660 Peritoneal adhesions (postprocedural) (postinfection): Secondary | ICD-10-CM | POA: Diagnosis not present

## 2024-10-06 DIAGNOSIS — K3532 Acute appendicitis with perforation and localized peritonitis, without abscess: Secondary | ICD-10-CM | POA: Diagnosis not present

## 2024-10-06 DIAGNOSIS — R1031 Right lower quadrant pain: Secondary | ICD-10-CM | POA: Diagnosis not present

## 2024-10-06 DIAGNOSIS — R918 Other nonspecific abnormal finding of lung field: Secondary | ICD-10-CM | POA: Diagnosis not present

## 2024-10-06 DIAGNOSIS — R1084 Generalized abdominal pain: Secondary | ICD-10-CM | POA: Diagnosis not present

## 2024-10-07 DIAGNOSIS — I2109 ST elevation (STEMI) myocardial infarction involving other coronary artery of anterior wall: Secondary | ICD-10-CM | POA: Diagnosis not present

## 2024-10-07 DIAGNOSIS — J439 Emphysema, unspecified: Secondary | ICD-10-CM | POA: Diagnosis not present

## 2024-10-07 DIAGNOSIS — F17210 Nicotine dependence, cigarettes, uncomplicated: Secondary | ICD-10-CM | POA: Diagnosis not present

## 2024-10-07 DIAGNOSIS — G4733 Obstructive sleep apnea (adult) (pediatric): Secondary | ICD-10-CM | POA: Diagnosis not present

## 2024-10-08 DIAGNOSIS — K353 Acute appendicitis with localized peritonitis, without perforation or gangrene: Secondary | ICD-10-CM | POA: Diagnosis not present

## 2024-10-09 DIAGNOSIS — K353 Acute appendicitis with localized peritonitis, without perforation or gangrene: Secondary | ICD-10-CM | POA: Diagnosis not present

## 2024-10-13 DIAGNOSIS — Z9049 Acquired absence of other specified parts of digestive tract: Secondary | ICD-10-CM | POA: Diagnosis not present

## 2024-10-13 DIAGNOSIS — G8929 Other chronic pain: Secondary | ICD-10-CM | POA: Diagnosis not present

## 2024-10-13 DIAGNOSIS — Z7901 Long term (current) use of anticoagulants: Secondary | ICD-10-CM | POA: Diagnosis not present

## 2024-10-13 DIAGNOSIS — M25511 Pain in right shoulder: Secondary | ICD-10-CM | POA: Diagnosis not present

## 2024-10-13 DIAGNOSIS — M62838 Other muscle spasm: Secondary | ICD-10-CM | POA: Diagnosis not present

## 2024-10-13 DIAGNOSIS — M48061 Spinal stenosis, lumbar region without neurogenic claudication: Secondary | ICD-10-CM | POA: Diagnosis not present

## 2024-10-13 DIAGNOSIS — M5441 Lumbago with sciatica, right side: Secondary | ICD-10-CM | POA: Diagnosis not present

## 2024-10-13 DIAGNOSIS — I82532 Chronic embolism and thrombosis of left popliteal vein: Secondary | ICD-10-CM | POA: Diagnosis not present

## 2024-10-14 DIAGNOSIS — M25511 Pain in right shoulder: Secondary | ICD-10-CM | POA: Diagnosis not present

## 2024-10-14 DIAGNOSIS — G8929 Other chronic pain: Secondary | ICD-10-CM | POA: Diagnosis not present

## 2024-10-14 DIAGNOSIS — M6281 Muscle weakness (generalized): Secondary | ICD-10-CM | POA: Diagnosis not present
